# Patient Record
Sex: Male | Born: 1955 | Hispanic: Yes | Marital: Married | Smoking: Former smoker
Health system: Southern US, Community
[De-identification: ages and names within clinical notes are randomized; demographics above are authoritative.]

## PROBLEM LIST (undated history)

## (undated) ENCOUNTER — Emergency Department: Admission: EM | Payer: Commercial Managed Care - PPO | Source: Home / Self Care

## (undated) DIAGNOSIS — I10 Essential (primary) hypertension: Secondary | ICD-10-CM

## (undated) DIAGNOSIS — M1990 Unspecified osteoarthritis, unspecified site: Secondary | ICD-10-CM

## (undated) DIAGNOSIS — G629 Polyneuropathy, unspecified: Secondary | ICD-10-CM

## (undated) DIAGNOSIS — K631 Perforation of intestine (nontraumatic): Secondary | ICD-10-CM

## (undated) DIAGNOSIS — G709 Myoneural disorder, unspecified: Secondary | ICD-10-CM

## (undated) DIAGNOSIS — R251 Tremor, unspecified: Secondary | ICD-10-CM

## (undated) DIAGNOSIS — K5792 Diverticulitis of intestine, part unspecified, without perforation or abscess without bleeding: Secondary | ICD-10-CM

## (undated) HISTORY — PX: COLON SURGERY: SHX602

## (undated) HISTORY — DX: Diverticulitis of intestine, part unspecified, without perforation or abscess without bleeding: K57.92

## (undated) HISTORY — PX: HERNIA REPAIR: SHX51

## (undated) HISTORY — DX: Perforation of intestine (nontraumatic): K63.1

## (undated) SURGERY — Surgical Case
Anesthesia: *Unknown

---

## 2007-09-05 ENCOUNTER — Other Ambulatory Visit: Payer: Self-pay

## 2007-09-05 ENCOUNTER — Emergency Department: Payer: Self-pay | Admitting: Emergency Medicine

## 2007-12-10 ENCOUNTER — Ambulatory Visit: Payer: Self-pay | Admitting: Family Medicine

## 2007-12-16 ENCOUNTER — Ambulatory Visit: Payer: Self-pay | Admitting: Family Medicine

## 2012-09-10 ENCOUNTER — Emergency Department: Payer: Self-pay | Admitting: Emergency Medicine

## 2015-04-09 ENCOUNTER — Emergency Department: Payer: Commercial Managed Care - PPO

## 2015-04-09 ENCOUNTER — Inpatient Hospital Stay
Admission: EM | Admit: 2015-04-09 | Discharge: 2015-04-13 | DRG: 330 | Disposition: A | Discharge status: Home / Self Care | Payer: Commercial Managed Care - PPO | Attending: Surgery | Admitting: Surgery

## 2015-04-09 ENCOUNTER — Encounter: Payer: Self-pay | Admitting: *Deleted

## 2015-04-09 ENCOUNTER — Emergency Department: Payer: Commercial Managed Care - PPO | Admitting: Anesthesiology

## 2015-04-09 ENCOUNTER — Encounter
Admission: EM | Disposition: A | Discharge status: Home / Self Care | Payer: Self-pay | Source: Home / Self Care | Attending: Surgery

## 2015-04-09 DIAGNOSIS — R69 Illness, unspecified: Secondary | ICD-10-CM | POA: Diagnosis not present

## 2015-04-09 DIAGNOSIS — K5700 Diverticulitis of small intestine with perforation and abscess without bleeding: Principal | ICD-10-CM | POA: Diagnosis present

## 2015-04-09 DIAGNOSIS — Z87891 Personal history of nicotine dependence: Secondary | ICD-10-CM

## 2015-04-09 DIAGNOSIS — R198 Other specified symptoms and signs involving the digestive system and abdomen: Secondary | ICD-10-CM | POA: Diagnosis present

## 2015-04-09 DIAGNOSIS — K631 Perforation of intestine (nontraumatic): Secondary | ICD-10-CM | POA: Diagnosis present

## 2015-04-09 HISTORY — PX: BOWEL RESECTION: SHX1257

## 2015-04-09 HISTORY — PX: LAPAROTOMY: SHX154

## 2015-04-09 LAB — HEPATIC FUNCTION PANEL
ALK PHOS: 81 U/L (ref 38–126)
ALT: 18 U/L (ref 17–63)
AST: 17 U/L (ref 15–41)
Albumin: 3.5 g/dL (ref 3.5–5.0)
BILIRUBIN DIRECT: 0.2 mg/dL (ref 0.1–0.5)
BILIRUBIN INDIRECT: 0.6 mg/dL (ref 0.3–0.9)
TOTAL PROTEIN: 6.9 g/dL (ref 6.5–8.1)
Total Bilirubin: 0.8 mg/dL (ref 0.3–1.2)

## 2015-04-09 LAB — LIPASE, BLOOD: Lipase: 38 U/L (ref 11–51)

## 2015-04-09 LAB — URINALYSIS COMPLETE WITH MICROSCOPIC (ARMC ONLY)
BACTERIA UA: NONE SEEN
Bilirubin Urine: NEGATIVE
Glucose, UA: NEGATIVE mg/dL
Hgb urine dipstick: NEGATIVE
Ketones, ur: NEGATIVE mg/dL
LEUKOCYTES UA: NEGATIVE
NITRITE: NEGATIVE
PH: 5 (ref 5.0–8.0)
Protein, ur: 30 mg/dL — AB
SPECIFIC GRAVITY, URINE: 1.026 (ref 1.005–1.030)

## 2015-04-09 LAB — CBC WITH DIFFERENTIAL/PLATELET
Basophils Absolute: 0 10*3/uL (ref 0–0.1)
Basophils Relative: 0 %
Eosinophils Absolute: 0.2 10*3/uL (ref 0–0.7)
Eosinophils Relative: 2 %
HEMATOCRIT: 41.3 % (ref 40.0–52.0)
HEMOGLOBIN: 14.1 g/dL (ref 13.0–18.0)
LYMPHS PCT: 16 %
Lymphs Abs: 1.5 10*3/uL (ref 1.0–3.6)
MCH: 29.4 pg (ref 26.0–34.0)
MCHC: 34.2 g/dL (ref 32.0–36.0)
MCV: 85.7 fL (ref 80.0–100.0)
MONOS PCT: 7 %
Monocytes Absolute: 0.6 10*3/uL (ref 0.2–1.0)
NEUTROS ABS: 6.7 10*3/uL — AB (ref 1.4–6.5)
Neutrophils Relative %: 75 %
Platelets: 279 10*3/uL (ref 150–440)
RBC: 4.82 MIL/uL (ref 4.40–5.90)
RDW: 13.1 % (ref 11.5–14.5)
WBC: 9 10*3/uL (ref 3.8–10.6)

## 2015-04-09 LAB — BASIC METABOLIC PANEL
ANION GAP: 13 (ref 5–15)
BUN: 31 mg/dL — ABNORMAL HIGH (ref 6–20)
CHLORIDE: 100 mmol/L — AB (ref 101–111)
CO2: 26 mmol/L (ref 22–32)
CREATININE: 1.55 mg/dL — AB (ref 0.61–1.24)
Calcium: 9.4 mg/dL (ref 8.9–10.3)
GFR calc Af Amer: 54 mL/min — ABNORMAL LOW (ref >60)
GFR calc non Af Amer: 47 mL/min — ABNORMAL LOW (ref >60)
Glucose, Bld: 134 mg/dL — ABNORMAL HIGH (ref 65–99)
Potassium: 2.9 mmol/L — CL (ref 3.5–5.1)
Sodium: 139 mmol/L (ref 135–145)

## 2015-04-09 LAB — LACTIC ACID, PLASMA: LACTIC ACID, VENOUS: 0.9 mmol/L (ref 0.5–2.0)

## 2015-04-09 LAB — TROPONIN I: Troponin I: <0.03 ng/mL (ref <0.031)

## 2015-04-09 SURGERY — LAPAROTOMY, EXPLORATORY
Anesthesia: General | Site: Abdomen | Wound class: Clean Contaminated

## 2015-04-09 MED ORDER — BUPIVACAINE-EPINEPHRINE (PF) 0.25% -1:200000 IJ SOLN
INTRAMUSCULAR | Status: AC
  Filled 2015-04-09: qty 90

## 2015-04-09 MED ORDER — CEFAZOLIN SODIUM 1-5 GM-% IV SOLN
1.0000 g | Freq: Four times a day (QID) | INTRAVENOUS | Status: DC
  Administered 2015-04-10 – 2015-04-13 (×13): 1 g via INTRAVENOUS
  Filled 2015-04-09 (×17): qty 50

## 2015-04-09 MED ORDER — ROCURONIUM BROMIDE 100 MG/10ML IV SOLN
INTRAVENOUS | Status: DC | PRN
  Administered 2015-04-09: 30 mg via INTRAVENOUS

## 2015-04-09 MED ORDER — DEXTROSE IN LACTATED RINGERS 5 % IV SOLN
INTRAVENOUS | Status: DC
  Administered 2015-04-10 – 2015-04-11 (×4): via INTRAVENOUS
  Administered 2015-04-11 (×2): 1 mL via INTRAVENOUS
  Administered 2015-04-12: 12:00:00 via INTRAVENOUS
  Administered 2015-04-12: 1 mL via INTRAVENOUS
  Administered 2015-04-13: 01:00:00 via INTRAVENOUS

## 2015-04-09 MED ORDER — GLYCOPYRROLATE 0.2 MG/ML IJ SOLN
INTRAMUSCULAR | Status: DC | PRN
  Administered 2015-04-09: 0.6 mg via INTRAVENOUS

## 2015-04-09 MED ORDER — PIPERACILLIN-TAZOBACTAM 3.375 G IVPB
3.3750 g | Freq: Once | INTRAVENOUS | Status: AC
  Administered 2015-04-09: 3.375 g via INTRAVENOUS
  Filled 2015-04-09: qty 50

## 2015-04-09 MED ORDER — FENTANYL CITRATE (PF) 100 MCG/2ML IJ SOLN
INTRAMUSCULAR | Status: DC | PRN
  Administered 2015-04-09 (×2): 50 ug via INTRAVENOUS

## 2015-04-09 MED ORDER — IOHEXOL 350 MG/ML SOLN
80.0000 mL | Freq: Once | INTRAVENOUS | Status: AC | PRN
  Administered 2015-04-09: 100 mL via INTRAVENOUS

## 2015-04-09 MED ORDER — MORPHINE SULFATE (PF) 2 MG/ML IV SOLN
2.0000 mg | INTRAVENOUS | Status: DC | PRN
  Administered 2015-04-10: 2 mg via INTRAVENOUS

## 2015-04-09 MED ORDER — PROMETHAZINE HCL 25 MG/ML IJ SOLN: 6.2500 mg | INTRAMUSCULAR | Status: DC | PRN

## 2015-04-09 MED ORDER — MORPHINE SULFATE (PF) 4 MG/ML IV SOLN
4.0000 mg | Freq: Once | INTRAVENOUS | Status: AC
  Administered 2015-04-09: 4 mg via INTRAVENOUS

## 2015-04-09 MED ORDER — ONDANSETRON HCL 4 MG PO TABS: 4.0000 mg | ORAL_TABLET | Freq: Four times a day (QID) | ORAL | Status: DC | PRN

## 2015-04-09 MED ORDER — ONDANSETRON HCL 4 MG/2ML IJ SOLN: 4.0000 mg | Freq: Four times a day (QID) | INTRAMUSCULAR | Status: DC | PRN

## 2015-04-09 MED ORDER — ONDANSETRON HCL 4 MG/2ML IJ SOLN
INTRAMUSCULAR | Status: DC | PRN
  Administered 2015-04-09: 4 mg via INTRAVENOUS

## 2015-04-09 MED ORDER — ONDANSETRON HCL 4 MG/2ML IJ SOLN
INTRAMUSCULAR | Status: AC
  Administered 2015-04-09: 4 mg via INTRAVENOUS
  Filled 2015-04-09: qty 2

## 2015-04-09 MED ORDER — HEPARIN SODIUM (PORCINE) 5000 UNIT/ML IJ SOLN
5000.0000 [IU] | Freq: Three times a day (TID) | INTRAMUSCULAR | Status: DC
  Administered 2015-04-10 – 2015-04-13 (×9): 5000 [IU] via SUBCUTANEOUS
  Filled 2015-04-09 (×9): qty 1

## 2015-04-09 MED ORDER — LIDOCAINE HCL (CARDIAC) 20 MG/ML IV SOLN
INTRAVENOUS | Status: DC | PRN
  Administered 2015-04-09: 40 mg via INTRAVENOUS

## 2015-04-09 MED ORDER — MORPHINE SULFATE (PF) 4 MG/ML IV SOLN
INTRAVENOUS | Status: AC
  Administered 2015-04-09: 4 mg via INTRAVENOUS
  Filled 2015-04-09: qty 1

## 2015-04-09 MED ORDER — SUCCINYLCHOLINE CHLORIDE 20 MG/ML IJ SOLN
INTRAMUSCULAR | Status: DC | PRN
Start: 2015-04-09 — End: 2015-04-09
  Administered 2015-04-09: 100 mg via INTRAVENOUS

## 2015-04-09 MED ORDER — NEOSTIGMINE METHYLSULFATE 10 MG/10ML IV SOLN
INTRAVENOUS | Status: DC | PRN
  Administered 2015-04-09: 4 mg via INTRAVENOUS

## 2015-04-09 MED ORDER — LACTATED RINGERS IV SOLN
INTRAVENOUS | Status: DC
  Administered 2015-04-09: 23:00:00 via INTRAVENOUS

## 2015-04-09 MED ORDER — BUPIVACAINE-EPINEPHRINE 0.25% -1:200000 IJ SOLN
INTRAMUSCULAR | Status: DC | PRN
  Administered 2015-04-09: 30 mL

## 2015-04-09 MED ORDER — FENTANYL CITRATE (PF) 100 MCG/2ML IJ SOLN
25.0000 ug | INTRAMUSCULAR | Status: DC | PRN
  Administered 2015-04-10 (×3): 25 ug via INTRAVENOUS

## 2015-04-09 MED ORDER — ONDANSETRON HCL 4 MG/2ML IJ SOLN
4.0000 mg | Freq: Once | INTRAMUSCULAR | Status: AC
  Administered 2015-04-09: 4 mg via INTRAVENOUS

## 2015-04-09 MED ORDER — PROPOFOL 10 MG/ML IV BOLUS
INTRAVENOUS | Status: DC | PRN
  Administered 2015-04-09: 150 mg via INTRAVENOUS

## 2015-04-09 MED ORDER — HEPARIN SODIUM (PORCINE) 5000 UNIT/ML IJ SOLN: 5000.0000 [IU] | Freq: Three times a day (TID) | INTRAMUSCULAR | Status: DC

## 2015-04-09 MED ORDER — IOHEXOL 240 MG/ML SOLN
25.0000 mL | Freq: Once | INTRAMUSCULAR | Status: AC | PRN
  Administered 2015-04-09: 25 mL via ORAL

## 2015-04-09 SURGICAL SUPPLY — 32 items
CANISTER SUCT 1200ML W/VALVE (MISCELLANEOUS) ×2 IMPLANT
CATH TRAY 16F METER LATEX (MISCELLANEOUS) ×2 IMPLANT
CHLORAPREP W/TINT 26ML (MISCELLANEOUS) ×2 IMPLANT
DRAPE LAPAROTOMY 100X77 ABD (DRAPES) ×2 IMPLANT
DRSG TELFA 3X8 NADH (GAUZE/BANDAGES/DRESSINGS) ×2 IMPLANT
GAUZE SPONGE 4X4 12PLY STRL (GAUZE/BANDAGES/DRESSINGS) ×2 IMPLANT
GLOVE BIO SURGEON STRL SZ8 (GLOVE) ×2 IMPLANT
GOWN STRL REUS W/ TWL LRG LVL3 (GOWN DISPOSABLE) ×2 IMPLANT
GOWN STRL REUS W/TWL LRG LVL3 (GOWN DISPOSABLE) ×2
KIT RM TURNOVER STRD PROC AR (KITS) ×2 IMPLANT
LABEL OR SOLS (LABEL) ×2 IMPLANT
NDL SAFETY 22GX1.5 (NEEDLE) ×2 IMPLANT
NS IRRIG 1000ML POUR BTL (IV SOLUTION) ×2 IMPLANT
PACK BASIN MAJOR ARMC (MISCELLANEOUS) ×2 IMPLANT
PACK COLON CLEAN CLOSURE (MISCELLANEOUS) ×2 IMPLANT
PAD GROUND ADULT SPLIT (MISCELLANEOUS) ×2 IMPLANT
RELOAD LINEAR CUT PROX 55 BLUE (ENDOMECHANICALS) ×4 IMPLANT
RELOAD STAPLER LINEAR PROX 30 (STAPLE) ×1 IMPLANT
SEPRAFILM MEMBRANE 5X6 (MISCELLANEOUS) ×2 IMPLANT
STAPLER PROXIMATE 55 BLUE (STAPLE) ×2 IMPLANT
STAPLER RELOAD LINEAR PROX 30 (STAPLE) ×2
STAPLER SKIN PROX 35W (STAPLE) ×2 IMPLANT
SUT PDS AB 1 TP1 54 (SUTURE) ×4 IMPLANT
SUT PROLENE 0 CT 1 30 (SUTURE) ×6 IMPLANT
SUT SILK 0 (SUTURE) ×1
SUT SILK 0 30XBRD TIE 6 (SUTURE) ×1 IMPLANT
SUT SILK 3-0 (SUTURE) ×2 IMPLANT
SUT VIC AB 3-0 SH 27 (SUTURE) ×1
SUT VIC AB 3-0 SH 27X BRD (SUTURE) ×1 IMPLANT
SUT VICRYL 2 0 18  UND BR (SUTURE) ×1
SUT VICRYL 2 0 18 UND BR (SUTURE) ×1 IMPLANT
SYRINGE 10CC LL (SYRINGE) ×2 IMPLANT

## 2015-04-09 NOTE — H&P (Signed)
Parker Bishop is an 59 y.o. male.    Chief Complaint: Abdominal pain  HPI: This patient with 2 days of abdominal pain started acutely and suddenly after drinking orange juice on Sunday and has been gradually worsening. He is accompanied by his family and a son is acting as an interpreter initially but a formal interpreter is utilized later for consent.  he describes nausea and vomiting normal bowel movement yesterday fevers or chills never had an episode before like this Denies chest pain and eyes dysuria History reviewed. No pertinent past medical history.  No past surgical history on file.  No family history on file. Social History:  reports that he has never smoked. He does not have any smokeless tobacco history on file. He reports that he does not drink alcohol. His drug history is not on file.  Allergies: No Known Allergies   (Not in a hospital admission)   Review of Systems  Constitutional: Negative for fever and chills.  HENT: Negative.   Eyes: Negative.   Respiratory: Negative.   Cardiovascular: Negative.   Gastrointestinal: Positive for heartburn, nausea, vomiting and abdominal pain. Negative for diarrhea, constipation, blood in stool and melena.  Genitourinary: Negative.   Musculoskeletal: Negative.   Skin: Negative.   Neurological: Negative.  Negative for weakness.  Endo/Heme/Allergies: Negative.   Psychiatric/Behavioral: Negative.      Physical Exam:  BP 117/84 mmHg  Pulse 86  Temp(Src) 98.4 F (36.9 C) (Oral)  Resp 20  Ht _0  (1.651 m)  Wt 180 lb (81.647 kg)  BMI 29.95 kg/m2  SpO2 98%  Physical Exam  Constitutional: He is oriented to person, place, and time. He appears distressed.  Disheveled and uncomfortable-appearing  HENT:  Head: Normocephalic and atraumatic.  Eyes: Pupils are equal, round, and reactive to light. Right eye exhibits no discharge. Left eye exhibits no discharge. No scleral icterus.  Neck: Normal range of motion. Neck supple.   Cardiovascular: Normal rate, regular rhythm and normal heart sounds.   Pulmonary/Chest: Effort normal and breath sounds normal. No respiratory distress. He has no wheezes. He has no rales.  Abdominal: Soft. Bowel sounds are normal. He exhibits distension. There is tenderness. There is rebound and guarding.  Maximal tenderness in left upper quadrant with percussion or rebound tenderness and a rigid abdomen  Genitourinary: Penis normal.  Musculoskeletal: Normal range of motion. He exhibits no edema.  Lymphadenopathy:    He has no cervical adenopathy.  Neurological: He is alert and oriented to person, place, and time.  Skin: Skin is warm. He is diaphoretic. No erythema.  Psychiatric: Affect normal.  Vitals reviewed.       Results for orders placed or performed during the hospital encounter of 04/09/15 (from the past 48 hour(s))  CBC WITH DIFFERENTIAL     Status: Abnormal   Collection Time: 04/09/15  4:55 PM  Result Value Ref Range   WBC 9.0 3.8 - 10.6 K/uL   RBC 4.82 4.40 - 5.90 MIL/uL   Hemoglobin 14.1 13.0 - 18.0 g/dL   HCT 41.3 40.0 - 52.0 %   MCV 85.7 80.0 - 100.0 fL   MCH 29.4 26.0 - 34.0 pg   MCHC 34.2 32.0 - 36.0 g/dL   RDW 13.1 11.5 - 14.5 %   Platelets 279 150 - 440 K/uL   Neutrophils Relative % 75 %   Neutro Abs 6.7 (H) 1.4 - 6.5 K/uL   Lymphocytes Relative 16 %   Lymphs Abs 1.5 1.0 - 3.6 K/uL   Monocytes  Relative 7 %   Monocytes Absolute 0.6 0.2 - 1.0 K/uL   Eosinophils Relative 2 %   Eosinophils Absolute 0.2 0 - 0.7 K/uL   Basophils Relative 0 %   Basophils Absolute 0.0 0 - 0.1 K/uL  Basic metabolic panel     Status: Abnormal   Collection Time: 04/09/15  4:55 PM  Result Value Ref Range   Sodium 139 135 - 145 mmol/L   Potassium 2.9 (LL) 3.5 - 5.1 mmol/L    Comment: CRITICAL RESULT CALLED TO, READ BACK BY AND VERIFIED WITH TERRY BROGAN AT 1823 ON 04/09/15 RWW    Chloride 100 (L) 101 - 111 mmol/L   CO2 26 22 - 32 mmol/L   Glucose, Bld 134 (H) 65 - 99 mg/dL    BUN 31 (H) 6 - 20 mg/dL   Creatinine, Ser 1.55 (H) 0.61 - 1.24 mg/dL   Calcium 9.4 8.9 - 10.3 mg/dL   GFR calc non Af Amer 47 (L) >60 mL/min   GFR calc Af Amer 54 (L) >60 mL/min    Comment: (NOTE) The eGFR has been calculated using the CKD EPI equation. This calculation has not been validated in all clinical situations. eGFR's persistently <60 mL/min signify possible Chronic Kidney Disease.    Anion gap 13 5 - 15  Lipase, blood     Status: None   Collection Time: 04/09/15  4:55 PM  Result Value Ref Range   Lipase 38 11 - 51 U/L  Urinalysis complete, with microscopic ([facility] only)     Status: Abnormal   Collection Time: 04/09/15  4:56 PM  Result Value Ref Range   Color, Urine YELLOW (A) YELLOW   APPearance CLEAR (A) CLEAR   Glucose, UA NEGATIVE NEGATIVE mg/dL   Bilirubin Urine NEGATIVE NEGATIVE   Ketones, ur NEGATIVE NEGATIVE mg/dL   Specific Gravity, Urine 1.026 1.005 - 1.030   Hgb urine dipstick NEGATIVE NEGATIVE   pH 5.0 5.0 - 8.0   Protein, ur 30 (A) NEGATIVE mg/dL   Nitrite NEGATIVE NEGATIVE   Leukocytes, UA NEGATIVE NEGATIVE   RBC / HPF 0-5 0 - 5 RBC/hpf   WBC, UA 0-5 0 - 5 WBC/hpf   Bacteria, UA NONE SEEN NONE SEEN   Squamous Epithelial / LPF 0-5 (A) NONE SEEN   Mucous PRESENT    Hyaline Casts, UA PRESENT   Lactic acid, plasma     Status: None   Collection Time: 04/09/15  6:15 PM  Result Value Ref Range   Lactic Acid, Venous 0.9 0.5 - 2.0 mmol/L  Troponin I     Status: None   Collection Time: 04/09/15  6:16 PM  Result Value Ref Range   Troponin I <0.03 <0.031 ng/mL    Comment:        NO INDICATION OF MYOCARDIAL INJURY.   Hepatic function panel     Status: None   Collection Time: 04/09/15  6:16 PM  Result Value Ref Range   Total Protein 6.9 6.5 - 8.1 g/dL   Albumin 3.5 3.5 - 5.0 g/dL   AST 17 15 - 41 U/L   ALT 18 17 - 63 U/L   Alkaline Phosphatase 81 38 - 126 U/L   Total Bilirubin 0.8 0.3 - 1.2 mg/dL   Bilirubin, Direct 0.2 0.1 - 0.5 mg/dL   Indirect  Bilirubin 0.6 0.3 - 0.9 mg/dL   Ct Abdomen Pelvis W Contrast  04/09/2015  CLINICAL DATA:  Generalized abdominal pain and right-sided back pain for several days. EXAM: CT  ABDOMEN AND PELVIS WITH CONTRAST TECHNIQUE: Multidetector CT imaging of the abdomen and pelvis was performed using the standard protocol following bolus administration of intravenous contrast. CONTRAST:  122m OMNIPAQUE IOHEXOL 350 MG/ML SOLN COMPARISON:  None. FINDINGS: Lung bases are within normal. Abdominal images demonstrate a normal liver, spleen, pancreas, gallbladder and adrenal glands. Stomach is within normal. Kidneys an ureters are normal. Appendix is normal. Minimal diverticulosis of the colon. There are several jejunal diverticula. There is inflammatory change within the mesentery adjacent several of the these closely approximated jejunal diverticula. There is evidence of perforation of the largest diverticula with mild adjacent extraluminal air. Findings compatible with acute jejunal diverticulitis with rupture. No evidence of bowel obstruction. Minimal calcified plaque over the aortic bifurcation and iliac arteries. Pelvic images demonstrate a right inguinal hernia containing the right anterior lateral corner of the bladder. Prostate and rectum are normal. No free pelvic fluid. Mild degenerate change of the spine and hips. IMPRESSION: Acute jejunal diverticulitis with focal perforation and mild adjacent extraluminal air. No bowel obstruction. Right inguinal hernia containing the right anterior lateral corner of the bladder. Minimal diverticulosis of the colon. Critical Value/emergent results were called by telephone at the time of interpretation on 04/09/2015 at 7:33 pm to Dr. PConni Slipper, who verbally acknowledged these results. Electronically Signed   By: DMarin OlpM.D.   On: 04/09/2015 19:33   Dg Chest Portable 1 View  04/09/2015  CLINICAL DATA:  Upper abdominal pain, nausea, vomiting and diarrhea for 3 days. Initial  encounter. EXAM: PORTABLE CHEST 1 VIEW COMPARISON:  None. FINDINGS: The lungs are well-aerated. Pulmonary vascularity is at the upper limits of normal. Minimal bibasilar atelectasis is noted. There is no evidence of pleural effusion or pneumothorax. The cardiomediastinal silhouette is borderline normal in size. No acute osseous abnormalities are seen. IMPRESSION: Minimal bibasilar atelectasis noted.  Lungs otherwise clear. Electronically Signed   By: JGarald BaldingM.D.   On: 04/09/2015 18:12     Assessment/Plan  CT scan and labs personally reviewed  I discussed this patient with Dr. MRip Harbourin the emergency room and utilizing a an interpreter I was able to discuss with the patient and family that his history and indications for surgery. He clearly has perforated jejunal diverticulitis with phlegmon and requires exploratory laparotomy and bowel resection rationale for this was discussed via the interpreter the options of observation reviewed the consequences of not operating were reviewed and the risks of bleeding infection recurrence anastomotic leak were all reviewed the understood and agreed to proceed  RFlorene Glen MD, FACS

## 2015-04-09 NOTE — ED Notes (Signed)
Patient completed contrast. CT made aware.

## 2015-04-09 NOTE — Anesthesia Procedure Notes (Signed)
Procedure Name: Intubation Date/Time: 04/09/2015 10:44 PM Performed by: Waldo LaineJUSTIS, Nkenge Sonntag Pre-anesthesia Checklist: Patient identified, Emergency Drugs available, Suction available, Patient being monitored and Timeout performed Patient Re-evaluated:Patient Re-evaluated prior to inductionOxygen Delivery Method: Circle system utilized Preoxygenation: Pre-oxygenation with 100% oxygen Intubation Type: IV induction and Cricoid Pressure applied Laryngoscope Size: Miller and 2 Grade View: Grade I Tube type: Oral Number of attempts: 1 Airway Equipment and Method: Stylet Placement Confirmation: ETT inserted through vocal cords under direct vision,  positive ETCO2 and breath sounds checked- equal and bilateral Secured at: 21 cm Tube secured with: Tape Dental Injury: Teeth and Oropharynx as per pre-operative assessment

## 2015-04-09 NOTE — ED Notes (Signed)
Surgeon here. Waiting for translator.

## 2015-04-09 NOTE — ED Notes (Signed)
Pt reports abd pain and right side back pain.  Sx for several day.  No n/v/d.  Pt has taken no otc meds for abd pain.  No known injury to back.  Denies urinary sx.  [employer] officernterpreter harry [phone]

## 2015-04-09 NOTE — Transfer of Care (Signed)
Immediate Anesthesia Transfer of Care Note  Patient: Parker Bishop  Procedure(s) Performed: Procedure(s): EXPLORATORY LAPAROTOMY (N/A) SMALL BOWEL RESECTION (N/A)  Patient Location: PACU  Anesthesia Type:General  Level of Consciousness: awake, alert  and oriented  Airway & Oxygen Therapy: Patient Spontanous Breathing and Patient connected to face mask oxygen  Post-op Assessment: Report given to RN and Post -op Vital signs reviewed and stable  Post vital signs: Reviewed  Last Vitals:  Filed Vitals:   04/09/15 2130  BP: 122/85  Pulse: 89  Temp:   Resp: 14    Complications: No apparent anesthesia complications

## 2015-04-09 NOTE — Op Note (Signed)
   Pre-operative Diagnosis: Perforated jejunal diverticulitis  Post-operative Diagnosis: Same  Surgeon: Monti Villers   Assistants: Surgical tech  Anesthesia: Gen.  Dionne Milo   Surgeon: Adah Salvageichard E. Excell Seltzerooper, MD FACS  Anesthesia: Gen. with endotracheal tube   Procedure Details  The patient was seen again in the Holding Room. The benefits, complications, treatment options, and expected outcomes were discussed with the patient. The risks of bleeding, infection, recurrence of symptoms, failure to resolve symptoms,  bowel injury, any of which could require further surgery were reviewed with the patient.   The patient was taken to Operating Room, [name] and the procedure verified.  A Time Out was held and the above information confirmed.  Prior to the induction of general anesthesia, antibiotic prophylaxis was administered. VTE prophylaxis was in place. General endotracheal anesthesia was then administered and tolerated well. After the induction, the abdomen was prepped with Chloraprep and draped in the sterile fashion. The patient was positioned in the supine position.  A midline incision was utilized to open and explore the abdomen. The small bowel was brought out of the abdominal cavity through the wound and run from the ligament of Treitz to the ileocecal valve. An area of the proximal jejunum was easily elevated and found to be phlegmon is with a mass on the mesenteric side. This mass was soft and appeared gas-filled. Resection was then performed as follows. A GIA stapler was fired on either side of the phlegmon in healthy small bowel. The mesentery was divided between clamps and tied with 0 silk ligatures. Specimen was sent off for examination.  Side-to-side anastomosis was then performed utilizing a GIA stapler and a TA stapler followed by reinforcement with imbricating 3-0 silk suture. The rent in the mesentery was closed with figure-of-eight 3-0 silk sutures as well.  The  remainder the abdomen was inspected there was no sign of any spillage of intestinal contents.  A piece of Seprafilm was placed and the wound was closed with running #1 PDS. 30 cc of Marcaine with epinephrine was infiltrated into the skin and subcutaneous tissues and then the wound was closed with skin staples and a sterile dressing was placed.  Once lap needle count was correct the patient was taken to the recovery room in stable condition to be admitted for continued care.  Prior to closure nasogastric tube location was confirmed.  Findings: Perforated jejunal diverticulitis with phlegmon no obvious spillage   Estimated Blood Loss: Minimal         Drains: None         Specimens: Segment of jejunum          Complications: None                  Condition: Stable   [name], MD, FACS

## 2015-04-09 NOTE — ED Notes (Signed)
Report given to OR nurse. Will send orderly for patient.

## 2015-04-09 NOTE — ED Provider Notes (Signed)
[facility] Emergency Department Provider Note  ____________________________________________  Time seen: Approximately 6:35 PM  I have reviewed the triage vital signs and the nursing notes.   HISTORY  Chief Complaint Abdominal Pain    HPI Parker Bishop is a 59 y.o. male patient reports that on Saturday 4 days ago he had some orange juice at work. After he drank the orange juice he began having abdominal pain radiating around through to the back and in his shoulder began hurting as well. Patient reports he vomits and this pain feels better he eats and the pain gets worse. Also reports the pain is made worse when he goes over bumps in the road on his way here. A as I understand has been going on constantly since then waxing and waning as I noted above pain is moderate to moderately severe. It's just a "pain" there is nothing else associated with that he is not having any diarrhea or constipation. Patient also reports she's having some chest pain which may be burning and heavy in nature. His been going on since the belly is been hurting.  History reviewed. No pertinent past medical history.  Past history is significant only for gastritis  There are no active problems to display for this patient.   No past surgical history on file.  No current outpatient prescriptions on file.  Allergies Review of patient's allergies indicates no known allergies.  No family history on file.  Social History Social History  Substance Use Topics  . Smoking status: Never Smoker   . Smokeless tobacco: None  . Alcohol Use: No    Review of Systems Constitutional: No fever/chills Eyes: No visual changes. ENT: No sore throat. Cardiovascular: Denies chest pain. Respiratory: Denies shortness of breath. Gastrointestinal:abdominal pain.   nausea,  vomiting.  No diarrhea.  No constipation. Genitourinary: Negative for dysuria. Musculoskeletal: back pain. Skin: Negative for  rash. Neurological: Negative for headaches, focal weakness or numbness.  10-point ROS otherwise negative.  ____________________________________________   PHYSICAL EXAM:  VITAL SIGNS: ED Triage Vitals  Enc Vitals Group     BP 04/09/15 1647 128/66 mmHg     Pulse Rate 04/09/15 1647 86     Resp 04/09/15 1647 20     Temp 04/09/15 1647 98.4 F (36.9 C)     Temp Source 04/09/15 1647 Oral     SpO2 04/09/15 1647 96 %     Weight 04/09/15 1647 180 lb (81.647 kg)     Height 04/09/15 1647 5\' 5"  (1.651 m)     Head Cir --      Peak Flow --      Pain Score --      Pain Loc --      Pain Edu? --      Excl. in GC? --     Constitutional: Alert and oriented. Well appearing and in no acute distress. Eyes: Conjunctivae are normal. PERRL. EOMI. Head: Atraumatic. Nose: No congestion/rhinnorhea. Mouth/Throat: Mucous membranes are moist.  Oropharynx non-erythematous. Neck: No stridor. Cardiovascular: Normal rate, regular rhythm. Grossly normal heart sounds.  Good peripheral circulation. Respiratory: Normal respiratory effort.  No retractions. Lungs CTAB. Gastrointestinal: Soft but diffusely tender. Bowel sounds are decreased. Pain is patient is tender to palpation and percussion especially in the middle of the abdomen around the umbilicus.. No distention. No abdominal bruits. No CVA tenderness. Musculoskeletal: No lower extremity tenderness nor edema.  No joint effusions. Neurologic:  Normal speech and language. No gross focal neurologic deficits are appreciated. No  gait instability. Skin:  Skin is warm, dry and intact. No rash noted. Psychiatric: Mood and affect are normal. Speech and behavior are normal.  ____________________________________________   LABS (all labs ordered are listed, but only abnormal results are displayed)  Labs Reviewed  CBC WITH DIFFERENTIAL/PLATELET - Abnormal; Notable for the following:    Neutro Abs 6.7 (*)    All other components within normal limits  BASIC  METABOLIC PANEL - Abnormal; Notable for the following:    Potassium 2.9 (*)    Chloride 100 (*)    Glucose, Bld 134 (*)    BUN 31 (*)    Creatinine, Ser 1.55 (*)    GFR calc non Af Amer 47 (*)    GFR calc Af Amer 54 (*)    All other components within normal limits  URINALYSIS COMPLETEWITH MICROSCOPIC ([facility] ONLY) - Abnormal; Notable for the following:    Color, Urine YELLOW (*)    APPearance CLEAR (*)    Protein, ur 30 (*)    Squamous Epithelial / LPF 0-5 (*)    All other components within normal limits  URINE CULTURE  LIPASE, BLOOD  TROPONIN I  LACTIC ACID, PLASMA  HEPATIC FUNCTION PANEL  LACTIC ACID, PLASMA  CBC WITH DIFFERENTIAL/PLATELET   ____________________________________________  EKG EKG read and interpreted by me shows normal sinus rhythm at a rate of 87 normal axis no acute ST-T wave changes ____________________________________________  RADIOLOGY  Radiology calls to report the CT shows a diver reticulated severe jejunum. One of these diverticulitis areas is perforated in her some free air adjacent to the jejunum. ____________________________________________   PROCEDURES   ____________________________________________   INITIAL IMPRESSION / ASSESSMENT AND PLAN / ED COURSE  Pertinent labs & imaging results that were available during my care of the patient were reviewed by me and considered in my medical decision making (see chart for details).  Dr. Excell Seltzer called comes down to see the patient. ____________________________________________   FINAL CLINICAL IMPRESSION(S) / ED DIAGNOSES  Final diagnoses:  Perforated viscus      Arnaldo Natal, MD 04/09/15 2033

## 2015-04-09 NOTE — Anesthesia Preprocedure Evaluation (Signed)
Anesthesia Evaluation  Patient identified by MRN, date of birth, ID band Patient awake    Reviewed: Allergy & Precautions, H&P , NPO status , Patient's Chart, lab work & pertinent test results, reviewed documented beta blocker date and time   History of Anesthesia Complications Negative for: history of anesthetic complications  Airway Mallampati: III  TM Distance: >3 FB Neck ROM: full    Dental  (+) Chipped, Poor Dentition   Pulmonary former smoker,    Pulmonary exam normal breath sounds clear to auscultation       Cardiovascular Exercise Tolerance: Good negative cardio ROS Normal cardiovascular exam Rhythm:regular Rate:Normal     Neuro/Psych negative neurological ROS  negative psych ROS   GI/Hepatic Neg liver ROS, GERD  ,  Endo/Other  negative endocrine ROS  Renal/GU negative Renal ROS  negative genitourinary   Musculoskeletal   Abdominal   Peds  Hematology negative hematology ROS (+)   Anesthesia Other Findings History reviewed. No pertinent past medical history.  Patient does not go to the doctor on a regular basis and has no medical problems that he is aware of.  He does admit to some pain/pressure in his chest, which accompanies the feeling of needing to burp.  He states that friends and family have told him that it is likely acid reflux, but he has never gone to the doctor to have it checked.  No known cardiac history.  Reproductive/Obstetrics negative OB ROS                             Anesthesia Physical Anesthesia Plan  ASA: III and emergent  Anesthesia Plan: General, Rapid Sequence and Cricoid Pressure   Post-op Pain Management:    Induction:   Airway Management Planned:   Additional Equipment:   Intra-op Plan:   Post-operative Plan:   Informed Consent: I have reviewed the patients History and Physical, chart, labs and discussed the procedure including the risks,  benefits and alternatives for the proposed anesthesia with the patient or authorized representative who has indicated his/her understanding and acceptance.   Dental Advisory Given  Plan Discussed with: Anesthesiologist, CRNA and Surgeon  Anesthesia Plan Comments:         Anesthesia Quick Evaluation

## 2015-04-10 ENCOUNTER — Encounter: Payer: Self-pay | Admitting: Surgery

## 2015-04-10 LAB — BASIC METABOLIC PANEL
ANION GAP: 8 (ref 5–15)
BUN: 24 mg/dL — ABNORMAL HIGH (ref 6–20)
CO2: 27 mmol/L (ref 22–32)
Calcium: 8.4 mg/dL — ABNORMAL LOW (ref 8.9–10.3)
Chloride: 101 mmol/L (ref 101–111)
Creatinine, Ser: 1.07 mg/dL (ref 0.61–1.24)
GLUCOSE: 185 mg/dL — AB (ref 65–99)
POTASSIUM: 3.2 mmol/L — AB (ref 3.5–5.1)
Sodium: 136 mmol/L (ref 135–145)

## 2015-04-10 LAB — CBC
HEMATOCRIT: 37.5 % — AB (ref 40.0–52.0)
HEMOGLOBIN: 13.1 g/dL (ref 13.0–18.0)
MCH: 30.1 pg (ref 26.0–34.0)
MCHC: 35.1 g/dL (ref 32.0–36.0)
MCV: 85.8 fL (ref 80.0–100.0)
Platelets: 257 10*3/uL (ref 150–440)
RBC: 4.37 MIL/uL — AB (ref 4.40–5.90)
RDW: 13.1 % (ref 11.5–14.5)
WBC: 8.2 10*3/uL (ref 3.8–10.6)

## 2015-04-10 LAB — LACTIC ACID, PLASMA: Lactic Acid, Venous: 0.9 mmol/L (ref 0.5–2.0)

## 2015-04-10 MED ORDER — ACETAMINOPHEN 325 MG PO TABS: 650.0000 mg | ORAL_TABLET | Freq: Four times a day (QID) | ORAL | Status: DC | PRN

## 2015-04-10 MED ORDER — MORPHINE SULFATE (PF) 4 MG/ML IV SOLN
4.0000 mg | INTRAVENOUS | Status: DC | PRN
  Administered 2015-04-10 (×5): 4 mg via INTRAVENOUS
  Filled 2015-04-10 (×4): qty 1

## 2015-04-10 MED ORDER — MORPHINE SULFATE 2 MG/ML IV SOLN
INTRAVENOUS | Status: DC
  Administered 2015-04-10: 2 mg via INTRAVENOUS
  Administered 2015-04-10: 3 mg via INTRAVENOUS
  Administered 2015-04-10: 12:00:00 via INTRAVENOUS
  Administered 2015-04-11: 0 mg via INTRAVENOUS
  Administered 2015-04-11: 06:00:00 via INTRAVENOUS
  Administered 2015-04-11: 4 mg via INTRAVENOUS
  Administered 2015-04-11: 19:00:00 via INTRAVENOUS
  Administered 2015-04-11: 2 mg via INTRAVENOUS
  Administered 2015-04-12 (×2): 6 mg via INTRAVENOUS
  Administered 2015-04-12: 15 mg via INTRAVENOUS
  Administered 2015-04-12: 7.5 mg via INTRAVENOUS
  Filled 2015-04-10 (×4): qty 25

## 2015-04-10 MED ORDER — FENTANYL CITRATE (PF) 100 MCG/2ML IJ SOLN
INTRAMUSCULAR | Status: AC
  Filled 2015-04-10: qty 2

## 2015-04-10 NOTE — Progress Notes (Signed)
1 Day Post-Op  Subjective: Status post small bowel resection for perforated jejunal diverticulitis. Patient feels better today but has continuous pain and requiring PCA use.  Objective: Vital signs in last 24 hours: Temp:  [97.9 F (36.6 C)-100.4 F (38 C)] 98.7 F (37.1 C) (11/16 1328) Pulse Rate:  [86-97] 97 (11/16 0422) Resp:  [10-24] 10 (11/16 1650) BP: (118-138)/(60-85) 132/64 mmHg (11/16 1328) SpO2:  [91 %-99 %] 91 % (11/16 1650) FiO2 (%):  [0 %] 0 % (11/16 1650)    Intake/Output from previous day: 11/15 0701 - 11/16 0700 In: 800 [I.V.:800] Out: 75 [Urine:75] Intake/Output this shift:    Physical exam:  Vital signs are stable he is afebrile abdomen is soft and minimally tender minimally distended wound is dressed.  Lab Results: CBC   Recent Labs  04/09/15 1655 04/10/15 0518  WBC 9.0 8.2  HGB 14.1 13.1  HCT 41.3 37.5*  PLT 279 257   BMET  Recent Labs  04/09/15 1655 04/10/15 0518  NA 139 136  K 2.9* 3.2*  CL 100* 101  CO2 26 27  GLUCOSE 134* 185*  BUN 31* 24*  CREATININE 1.55* 1.07  CALCIUM 9.4 8.4*   PT/INR No results for input(s): LABPROT, INR in the last 72 hours. ABG No results for input(s): PHART, HCO3 in the last 72 hours.  Invalid input(s): PCO2, PO2  Studies/Results: Ct Abdomen Pelvis W Contrast  04/09/2015  CLINICAL DATA:  Generalized abdominal pain and right-sided back pain for several days. EXAM: CT ABDOMEN AND PELVIS WITH CONTRAST TECHNIQUE: Multidetector CT imaging of the abdomen and pelvis was performed using the standard protocol following bolus administration of intravenous contrast. CONTRAST:  100mL OMNIPAQUE IOHEXOL 350 MG/ML SOLN COMPARISON:  None. FINDINGS: Lung bases are within normal. Abdominal images demonstrate a normal liver, spleen, pancreas, gallbladder and adrenal glands. Stomach is within normal. Kidneys an ureters are normal. Appendix is normal. Minimal diverticulosis of the colon. 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 There is inflammatory change within the mesentery adjacent several of the these closely approximated jejunal diverticula. There is evidence of perforation of the largest diverticula with mild adjacent extraluminal air. Findings compatible with acute jejunal diverticulitis with rupture. No evidence of bowel obstruction. Minimal calcified plaque over the aortic bifurcation and iliac arteries. Pelvic images demonstrate a right inguinal hernia containing the right anterior lateral corner of the bladder. Prostate and rectum are normal. No free pelvic fluid. Mild degenerate change of the spine and hips. IMPRESSION: Acute jejunal diverticulitis with focal perforation and mild adjacent extraluminal air. No bowel obstruction. Right inguinal hernia containing the right anterior lateral corner of the bladder. Minimal diverticulosis of the colon. Critical Value/emergent results were called by telephone at the time of interpretation on 04/09/2015 at 7:33 pm to Dr. Dorothea GlassmanPAUL MALINDA , who verbally acknowledged these results. Electronically Signed   By: Elberta Fortisaniel  Boyle M.D.   On: 04/09/2015 19:33   Dg Chest Portable 1 View  04/09/2015  CLINICAL DATA:  Upper abdominal pain, nausea, vomiting and diarrhea for 3 days. Initial encounter. EXAM: PORTABLE CHEST 1 VIEW COMPARISON:  None. FINDINGS: The lungs are well-aerated. Pulmonary vascularity is at the upper limits of normal. Minimal bibasilar atelectasis is noted. There is no evidence of pleural effusion or pneumothorax. The cardiomediastinal silhouette is borderline normal in size. No acute osseous abnormalities are seen. IMPRESSION: Minimal bibasilar atelectasis noted.  Lungs otherwise clear. Electronically Signed   By: Roanna RaiderJeffery  Chang M.D.   On: 04/09/2015 18:12    Anti-infectives: Anti-infectives  Start     Dose/Rate Route Frequency Ordered Stop   04/10/15 0000  ceFAZolin (ANCEF) IVPB 1 g/50 mL premix     1 g 100 mL/hr over 30 Minutes Intravenous 4 times per day  04/09/15 2338     04/09/15 1945  piperacillin-tazobactam (ZOSYN) IVPB 3.375 g     3.375 g 12.5 mL/hr over 240 Minutes Intravenous  Once 04/09/15 1936 04/09/15 2352      Assessment/Plan: s/p Procedure(s): EXPLORATORY LAPAROTOMY SMALL BOWEL RESECTION   Patient doing well continue IV antibiotics and fluids as well as PCA he will require the nasogastric tube is well will discontinue Foley tomorrow morning.  Lattie Haw, MD, FACS  04/10/2015

## 2015-04-10 NOTE — Anesthesia Postprocedure Evaluation (Signed)
  Anesthesia Post-op Note  Patient: Parker Bishop  Procedure(s) Performed: Procedure(s): EXPLORATORY LAPAROTOMY (N/A) SMALL BOWEL RESECTION (N/A)  Anesthesia type:General, Rapid Sequence, Cricoid Pressure  Patient location: PACU  Post pain: Pain level controlled  Post assessment: Post-op Vital signs reviewed, Patient's Cardiovascular Status Stable, Respiratory Function Stable, Patent Airway and No signs of Nausea or vomiting  Post vital signs: Reviewed and stable Level of consciousness: awake, alert  and patient cooperative  Complications: No apparent anesthesia complications

## 2015-04-10 NOTE — Progress Notes (Signed)
Pt complaining of continual pain in abdomen, not relieved with prn morphine. New orders received for PCA pump. Utilized interpreter to educate pt on PCA use. PCA set up. Continue to assess.

## 2015-04-11 LAB — CBC WITH DIFFERENTIAL/PLATELET
Basophils Absolute: 0 10*3/uL (ref 0–0.1)
Basophils Relative: 1 %
Eosinophils Absolute: 0.1 10*3/uL (ref 0–0.7)
Eosinophils Relative: 1 %
HEMATOCRIT: 36.3 % — AB (ref 40.0–52.0)
Hemoglobin: 12.1 g/dL — ABNORMAL LOW (ref 13.0–18.0)
LYMPHS ABS: 0.5 10*3/uL — AB (ref 1.0–3.6)
LYMPHS PCT: 9 %
MCH: 28.9 pg (ref 26.0–34.0)
MCHC: 33.4 g/dL (ref 32.0–36.0)
MCV: 86.4 fL (ref 80.0–100.0)
MONO ABS: 0.5 10*3/uL (ref 0.2–1.0)
MONOS PCT: 9 %
NEUTROS ABS: 4.9 10*3/uL (ref 1.4–6.5)
Neutrophils Relative %: 80 %
Platelets: 241 10*3/uL (ref 150–440)
RBC: 4.2 MIL/uL — ABNORMAL LOW (ref 4.40–5.90)
RDW: 12.9 % (ref 11.5–14.5)
WBC: 6 10*3/uL (ref 3.8–10.6)

## 2015-04-11 LAB — URINE CULTURE
Culture: NO GROWTH
SPECIAL REQUESTS: NORMAL

## 2015-04-11 LAB — BASIC METABOLIC PANEL
ANION GAP: 6 (ref 5–15)
BUN: 16 mg/dL (ref 6–20)
CALCIUM: 8.6 mg/dL — AB (ref 8.9–10.3)
CO2: 30 mmol/L (ref 22–32)
CREATININE: 0.78 mg/dL (ref 0.61–1.24)
Chloride: 105 mmol/L (ref 101–111)
GLUCOSE: 148 mg/dL — AB (ref 65–99)
Potassium: 3.4 mmol/L — ABNORMAL LOW (ref 3.5–5.1)
Sodium: 141 mmol/L (ref 135–145)

## 2015-04-11 LAB — SURGICAL PATHOLOGY

## 2015-04-11 MED ORDER — MENTHOL 3 MG MT LOZG
1.0000 | LOZENGE | OROMUCOSAL | Status: DC | PRN
  Filled 2015-04-11: qty 9

## 2015-04-11 NOTE — Progress Notes (Signed)
Surgery  POD 2   S/P mall bowel resection for perforated jejunal diverticulitis.  The patient's Foley catheter was removed. He voided 75 cc. A postvoid residual bladder scan has yet to be obtained. Patient denies any passage of flatus. Upon arrival to the room the patient's nasogastric tube had been pulled back significantly. I repositioned it.  He is complaining of incisional pain. Seems well controlled on the existing PCA regimen.   Filed Vitals:   04/11/15 0748 04/11/15 1200 04/11/15 1357 04/11/15 1600  BP:   157/81   Pulse:   92   Temp:   98.4 F (36.9 C)   TempSrc:   Oral   Resp: 13 11  16   Height:      Weight:      SpO2: 93% 93% 97% 94%    PE: Alert and oriented., Lungs are clear bilaterally. Abdomen is soft with incisional tenderness and dressing is dry. Nasogastric tube is draining bilious fluid. Repositioned with return of 200 cc immediately.   Labs  CBC Latest Ref Rng 04/11/2015 04/10/2015 04/09/2015  WBC 3.8 - 10.6 K/uL 6.0 8.2 9.0  Hemoglobin 13.0 - 18.0 g/dL 12.1(L) 13.1 14.1  Hematocrit 40.0 - 52.0 % 36.3(L) 37.5(L) 41.3  Platelets 150 - 440 K/uL 241 257 279   CMP Latest Ref Rng 04/11/2015 04/10/2015 04/09/2015  Glucose 65 - 99 mg/dL 161(W148(H) 960(A185(H) 540(J134(H)  BUN 6 - 20 mg/dL 16 81(X24(H) 91(Y31(H)  Creatinine 0.61 - 1.24 mg/dL 7.820.78 9.561.07 2.13(Y1.55(H)  Sodium 135 - 145 mmol/L 141 136 139  Potassium 3.5 - 5.1 mmol/L 3.4(L) 3.2(L) 2.9(LL)  Chloride 101 - 111 mmol/L 105 101 100(L)  CO2 22 - 32 mmol/L 30 27 26   Calcium 8.9 - 10.3 mg/dL 8.6(V8.6(L) 7.8(I8.4(L) 9.4  Total Protein 6.5 - 8.1 g/dL - - 6.9  Total Bilirubin 0.3 - 1.2 mg/dL - - 0.8  Alkaline Phos 38 - 126 U/L - - 81  AST 15 - 41 U/L - - 17  ALT 17 - 63 U/L - - 18    I/O last 3 completed shifts: In: 4699.5 [I.V.:4379.5; NG/GT:170; IV Piggyback:150] Out: 1075 [Urine:925; Emesis/NG output:150] Total I/O In: 1677.5 [I.V.:1527.5; IV Piggyback:150] Out: 0     IMP  he is doing well. He may require reinsertion of the Foley  catheter if there is a large postvoid residual and if he has no postvoid residual present he'll require extra IV fluids.   Plan:  Continue IV antibiotics and nasogastric tube with or without insertion of Foley versus extra IV fluids.

## 2015-04-12 MED ORDER — HYDROCODONE-ACETAMINOPHEN 5-325 MG PO TABS
1.0000 | ORAL_TABLET | Freq: Four times a day (QID) | ORAL | Status: DC | PRN
  Administered 2015-04-12 – 2015-04-13 (×2): 1 via ORAL
  Filled 2015-04-12 (×2): qty 1

## 2015-04-12 MED ORDER — MORPHINE SULFATE (PF) 2 MG/ML IV SOLN: 2.0000 mg | INTRAVENOUS | Status: DC | PRN

## 2015-04-12 NOTE — Progress Notes (Signed)
Pt ambulated to bathroom with assistance and did very well. Pain is controlled with PCA. Will continue to monitor pt.  Karsten RoLauren E Hobbs

## 2015-04-12 NOTE — Progress Notes (Signed)
Surgery  POD 3   S/P small bowel resection for perforated jejunal diverticulitis.  Patient is without complaints of pain. He voided well after Foley catheter yesterday and did not require reinsertion of the Foley catheter.  He has not passed any gas yet nor had a bowel movement.  BP 152/80   Filed Vitals:   04/12/15 0411 04/12/15 0800 04/12/15 1024 04/12/15 1131  BP:      Pulse:      Temp:      TempSrc:      Resp: 14 11 13 15   Height:      Weight:      SpO2:  98% 100% 98%    PE: Dressing is dry. Abdomen is soft. Nondistended. Nasogastric tube is minimal. Extremities are warm and well-perfused.  Labs  CBC Latest Ref Rng 04/11/2015 04/10/2015 04/09/2015  WBC 3.8 - 10.6 K/uL 6.0 8.2 9.0  Hemoglobin 13.0 - 18.0 g/dL 12.1(L) 13.1 14.1  Hematocrit 40.0 - 52.0 % 36.3(L) 37.5(L) 41.3  Platelets 150 - 440 K/uL 241 257 279   CMP Latest Ref Rng 04/11/2015 04/10/2015 04/09/2015  Glucose 65 - 99 mg/dL 409(W148(H) 119(J185(H) 478(G134(H)  BUN 6 - 20 mg/dL 16 95(A24(H) 21(H31(H)  Creatinine 0.61 - 1.24 mg/dL 0.860.78 5.781.07 4.69(G1.55(H)  Sodium 135 - 145 mmol/L 141 136 139  Potassium 3.5 - 5.1 mmol/L 3.4(L) 3.2(L) 2.9(LL)  Chloride 101 - 111 mmol/L 105 101 100(L)  CO2 22 - 32 mmol/L 30 27 26   Calcium 8.9 - 10.3 mg/dL 2.9(B8.6(L) 2.8(U8.4(L) 9.4  Total Protein 6.5 - 8.1 g/dL - - 6.9  Total Bilirubin 0.3 - 1.2 mg/dL - - 0.8  Alkaline Phos 38 - 126 U/L - - 81  AST 15 - 41 U/L - - 17  ALT 17 - 63 U/L - - 18    I/O last 3 completed shifts: In: 6308 [I.V.:5638; NG/GT:470; IV Piggyback:200] Out: 2375 [Urine:1725; Emesis/NG output:650] Total I/O In: 888 [IV Piggyback:888] Out: 250 [Urine:250]    IMP improving. Nasogastric tube can be removed.  Plan his nasogastric tube will be removed. His diet will be advanced to clear liquids. PCA was discontinued and transitioned over to oral narcotics with IV back up. The patient was examined and interviewed with a certified spanish interpreter.

## 2015-04-13 MED ORDER — ONDANSETRON HCL 4 MG PO TABS: 4.0000 mg | ORAL_TABLET | Freq: Three times a day (TID) | ORAL | Status: DC | PRN

## 2015-04-13 MED ORDER — HYDROCODONE-ACETAMINOPHEN 5-325 MG PO TABS: 1.0000 | ORAL_TABLET | Freq: Four times a day (QID) | ORAL | Status: DC | PRN

## 2015-04-13 NOTE — Discharge Instructions (Signed)
*  Notify your doctor for signs of infection such as fever, increasing pain not relieved from the prescribed meds, drainage or redness at the incision. *Take all medications as prescribed.  * Drink plenty of fluids. *Notify MD with any questions or concerns.

## 2015-04-13 NOTE — Discharge Summary (Signed)
Physician Discharge Summary  Patient ID: Parker Bishop MRN: [id number] DOB/AGE: May 17, 1956 59 y.o.  Admit date: 04/09/2015 Discharge date: 04/13/2015  Admission Diagnoses: Perforated viscus  Discharge Diagnoses:  Active Problems:   Perforated viscus  secondary to perforated jejunal diverticulitis  Discharged Condition: Stable and improved  Hospital Course:  Patient was brought to the operating room the day of his admission. Porter laparotomy with small bowel resection was performed. Postoperatively nasogastric tube was left in place. Pain was well-controlled with PCA. On postoperative day #2 the patient had his Foley catheter removed and he was able to void without any further difficulty.  Postoperative day #3 the patient was continuing to do well. His nasogastric tube was removed at this time. Diet was advanced. By the 19th patient was doing very well and tolerating a regular diet with adequate oral pain control. His incision was clean dry and intact. He was discharged home in stable and satisfactory condition.  Discharge Exam: Blood pressure 165/88, pulse 69, temperature 98.6 F (37 C), temperature source Oral, resp. rate 18, height 5\' 5"  (1.651 m), weight 180 lb (81.647 kg), SpO2 94 %. Soft abdomen, incision is clean and dry.  Disposition: home with self care.  Discharge Instructions    Call MD for:  persistant nausea and vomiting    Complete by:  As directed      Call MD for:  redness, tenderness, or signs of infection (pain, swelling, redness, odor or green/yellow discharge around incision site)    Complete by:  As directed      Call MD for:  severe uncontrolled pain    Complete by:  As directed      Call MD for:  temperature >100.4    Complete by:  As directed      Diet - low sodium heart healthy    Complete by:  As directed      Discharge instructions    Complete by:  As directed   DISCHARGE INSTRUCTIONS TO PATIENT  REMINDER:  Carry a list of your medications and  allergies with you at all times Call your pharmacy at least 1 week in advance to refill prescriptions Do not mix any prescribed pain medicine with alcohol Do not drive any motor vehicles while taking pain medication. Take medications with food.  Do not retake a pain medication if you vomit after taking it.  Activity: no lifting more than 15 pounds until instructed by your doctor.   Dressing Care Instruction (if applicable):              Remove operative dressings in 48 hours.  May Shower-  Call office if any questions regarding this activity.  Dry Dressing as needed to operative site.  Drain care instructions provided to you in the hospital.   Follow-up appointments (date to return to physician): Call for appointment with Dr. Natale LayMark Zidane Renner, MD at [phone] or [phone]  If need MD on call after hours and on weekends call Hospital operator at [phone] as ask to speak to [facility].  Call Surgeon if you have: Temperature greater than 100.4 Persistent nausea and vomiting Severe uncontrolled pain Redness, tenderness, or signs of infection (pain, swelling, redness, odor or green/yellow discharge around the site) Difficulty breathing, headache or visual disturbances Hives Persistent dizziness or light-headedness Extreme fatigue Any other questions or concerns you may have after discharge  In an emergency, call 911 or go to an Emergency Department at a nearby hospital  Diet:  Resume your  usual diet.  Avoid spicy, greasy or heavy foods.  If you have nausea or vomiting, go back to liquids.  If you cannot keep liquids down, call your doctor.  Avoid alcohol consumption while on prescription pain medications. Good nutrition promotes healing. Increase fiber and fluids.     I understand and acknowledge receipt of the above instructions.                                                                                                                                         Patient or Guardian Signature                                                                    Date/Time                                                                                                                                        Physician's or R.N.'s Signature                                                                  Date/Time  The discharge instructions have been reviewed with the patient and/or Family Member/Parent/Guardian.  Patient and/or Family Member/Parent/Guardian signed and retained a printed copy.     Discharge wound care:    Complete by:  As directed   May shower     Increase activity slowly    Complete by:  As directed             Medication List    TAKE these medications        diphenhydrAMINE 25 MG tablet  Commonly known as:  BENADRYL  Take 75 mg by mouth every 6 (six) hours as needed (for headaches/back pain).     HYDROcodone-acetaminophen 5-325 MG tablet  Commonly known as:  NORCO/VICODIN  Take 1-2  tablets by mouth every 6 (six) hours as needed for moderate pain.     ondansetron 4 MG tablet  Commonly known as:  ZOFRAN  Take 1 tablet (4 mg total) by mouth every 8 (eight) hours as needed for nausea.           Follow-up Information    Follow up with Dionne Milo, MD In 1 week.   Specialty:  Surgery   Why:  For suture removal, For wound re-check   Contact information:   [address] [place] [address] [phone]       Signed: Natale Lay 04/13/2015, 11:30 AM

## 2015-04-13 NOTE — Progress Notes (Signed)
04/13/2015   BP 165/88 mmHg  Pulse 69  Temp(Src) 98.6 F (37 C) (Oral)  Resp 18  Ht 5\' 5"  (1.651 m)  Wt 81.647 kg (180 lb)  BMI 29.95 kg/m2  SpO2 94% Patient discharged per MD orders. Midline incision with staples intact. No drainage. Discharge instructions reviewed via facility interpreter with patient and patient verbalized understanding. IV removed per policy. Prescriptions discussed and given to patient. Discharged via wheelchair escorted by nursing staff.  Ron ParkerHerron, Akiyah Eppolito D, RN

## 2015-04-13 NOTE — Final Progress Note (Signed)
Surgery  POD 3   S/P small bowel resection for perforated jejunal diverticulitis.  The patient is without complaints. He is tolerating a regular diet. His abdomen is soft and nontender. He is without complaints of pain. No nausea no vomiting no difficulty voiding.  Filed Vitals:   04/12/15 1131 04/12/15 1414 04/12/15 2045 04/13/15 0648  BP:  159/98 153/93 165/88  Pulse:  76 79 69  Temp:   99.8 F (37.7 C) 98.6 F (37 C)  TempSrc:   Oral Oral  Resp: 15  16 18   Height:      Weight:      SpO2: 98% 100% 95% 94%    PE: Dressing was removed. Dressing was dry. Staple lines intact. No drainage is noted.  Labs  CBC Latest Ref Rng 04/11/2015 04/10/2015 04/09/2015  WBC 3.8 - 10.6 K/uL 6.0 8.2 9.0  Hemoglobin 13.0 - 18.0 g/dL 12.1(L) 13.1 14.1  Hematocrit 40.0 - 52.0 % 36.3(L) 37.5(L) 41.3  Platelets 150 - 440 K/uL 241 257 279   CMP Latest Ref Rng 04/11/2015 04/10/2015 04/09/2015  Glucose 65 - 99 mg/dL 161(W148(H) 960(A185(H) 540(J134(H)  BUN 6 - 20 mg/dL 16 81(X24(H) 91(Y31(H)  Creatinine 0.61 - 1.24 mg/dL 7.820.78 9.561.07 2.13(Y1.55(H)  Sodium 135 - 145 mmol/L 141 136 139  Potassium 3.5 - 5.1 mmol/L 3.4(L) 3.2(L) 2.9(LL)  Chloride 101 - 111 mmol/L 105 101 100(L)  CO2 22 - 32 mmol/L 30 27 26   Calcium 8.9 - 10.3 mg/dL 8.6(V8.6(L) 7.8(I8.4(L) 9.4  Total Protein 6.5 - 8.1 g/dL - - 6.9  Total Bilirubin 0.3 - 1.2 mg/dL - - 0.8  Alkaline Phos 38 - 126 U/L - - 81  AST 15 - 41 U/L - - 17  ALT 17 - 63 U/L - - 18    I/O last 3 completed shifts: In: 3428 [P.O.:880; I.V.:1610; NG/GT:50; IV Piggyback:888] Out: 3350 [Urine:2850; Emesis/NG output:500] Total I/O In: 391 [I.V.:391] Out: -     IMP he is doing very well on postoperative day #3 and is stable and improved for discharge.  Plan  stable for discharge. Follow-up in our office in 1 week's time. May shower. Discharge instructions were provided to the patient with a certified Spanish interpreter.

## 2015-04-15 ENCOUNTER — Telehealth: Payer: Self-pay

## 2015-04-15 NOTE — Telephone Encounter (Signed)
Patient had Exploratory Laparotomy with perforated diverticulitis on 11/15 with Dr. Excell Seltzerooper. Patient needs to have follow-up call since he was discharged on 11/19. He also needs to have a post-op appointment on 11/23 or 11/30, depending on which is better for patient. Please call.

## 2015-04-16 ENCOUNTER — Encounter: Payer: Self-pay | Admitting: *Deleted

## 2015-04-16 NOTE — Telephone Encounter (Signed)
Called patient and spoke to his wife Clotilde Dieter(Rosa) since patient was not home at this time. I asked Clotilde DieterRosa how her husband was doing and she stated that he was doing great. I asked her if it was okay for us to schedule her husband a post-operation appointment since we only had two slots available. Rosa agreed. I told her that his appointment would be tomorrow 04/17/2015 at 3:15 PM. I also told her that I would get an interpreter [facility]. Rosa understood and had no further questions.

## 2015-04-17 ENCOUNTER — Encounter: Payer: Self-pay | Admitting: Surgery

## 2015-04-18 ENCOUNTER — Emergency Department: Payer: Commercial Managed Care - PPO

## 2015-04-18 ENCOUNTER — Encounter: Payer: Self-pay | Admitting: *Deleted

## 2015-04-18 ENCOUNTER — Emergency Department
Admission: EM | Admit: 2015-04-18 | Discharge: 2015-04-18 | Disposition: A | Discharge status: Home / Self Care | Payer: Commercial Managed Care - PPO | Attending: Emergency Medicine | Admitting: Emergency Medicine

## 2015-04-18 DIAGNOSIS — R1084 Generalized abdominal pain: Secondary | ICD-10-CM | POA: Diagnosis not present

## 2015-04-18 DIAGNOSIS — Z79899 Other long term (current) drug therapy: Secondary | ICD-10-CM | POA: Diagnosis not present

## 2015-04-18 LAB — LIPASE, BLOOD: Lipase: 50 U/L (ref 11–51)

## 2015-04-18 LAB — URINALYSIS COMPLETE WITH MICROSCOPIC (ARMC ONLY)
Bacteria, UA: NONE SEEN
Bilirubin Urine: NEGATIVE
Glucose, UA: NEGATIVE mg/dL
HGB URINE DIPSTICK: NEGATIVE
KETONES UR: NEGATIVE mg/dL
NITRITE: NEGATIVE
PROTEIN: NEGATIVE mg/dL
RBC / HPF: NONE SEEN RBC/hpf (ref 0–5)
SPECIFIC GRAVITY, URINE: 1.025 (ref 1.005–1.030)
Squamous Epithelial / LPF: NONE SEEN
pH: 6 (ref 5.0–8.0)

## 2015-04-18 LAB — CBC WITH DIFFERENTIAL/PLATELET
Basophils Absolute: 0.1 10*3/uL (ref 0–0.1)
Basophils Relative: 1 %
EOS ABS: 0.2 10*3/uL (ref 0–0.7)
Eosinophils Relative: 3 %
HEMATOCRIT: 41 % (ref 40.0–52.0)
HEMOGLOBIN: 13.9 g/dL (ref 13.0–18.0)
LYMPHS ABS: 1.3 10*3/uL (ref 1.0–3.6)
LYMPHS PCT: 20 %
MCH: 29 pg (ref 26.0–34.0)
MCHC: 33.8 g/dL (ref 32.0–36.0)
MCV: 85.9 fL (ref 80.0–100.0)
Monocytes Absolute: 0.4 10*3/uL (ref 0.2–1.0)
Monocytes Relative: 6 %
NEUTROS ABS: 4.4 10*3/uL (ref 1.4–6.5)
NEUTROS PCT: 70 %
Platelets: 381 10*3/uL (ref 150–440)
RBC: 4.78 MIL/uL (ref 4.40–5.90)
RDW: 13.1 % (ref 11.5–14.5)
WBC: 6.3 10*3/uL (ref 3.8–10.6)

## 2015-04-18 LAB — COMPREHENSIVE METABOLIC PANEL
ALT: 97 U/L — ABNORMAL HIGH (ref 17–63)
ANION GAP: 7 (ref 5–15)
AST: 58 U/L — ABNORMAL HIGH (ref 15–41)
Albumin: 3.6 g/dL (ref 3.5–5.0)
Alkaline Phosphatase: 78 U/L (ref 38–126)
BUN: 19 mg/dL (ref 6–20)
CALCIUM: 8.9 mg/dL (ref 8.9–10.3)
CHLORIDE: 104 mmol/L (ref 101–111)
CO2: 23 mmol/L (ref 22–32)
Creatinine, Ser: 0.89 mg/dL (ref 0.61–1.24)
GFR calc non Af Amer: >60 mL/min (ref >60)
Glucose, Bld: 126 mg/dL — ABNORMAL HIGH (ref 65–99)
POTASSIUM: 4.3 mmol/L (ref 3.5–5.1)
SODIUM: 134 mmol/L — AB (ref 135–145)
Total Bilirubin: 0.5 mg/dL (ref 0.3–1.2)
Total Protein: 6.8 g/dL (ref 6.5–8.1)

## 2015-04-18 MED ORDER — HYDROMORPHONE HCL 1 MG/ML IJ SOLN
1.0000 mg | Freq: Once | INTRAMUSCULAR | Status: AC
  Administered 2015-04-18: 1 mg via INTRAVENOUS
  Filled 2015-04-18: qty 1

## 2015-04-18 MED ORDER — SENNA 8.6 MG PO TABS: 2.0000 | ORAL_TABLET | Freq: Two times a day (BID) | ORAL | Status: DC

## 2015-04-18 MED ORDER — DOCUSATE SODIUM 100 MG PO CAPS: 200.0000 mg | ORAL_CAPSULE | Freq: Two times a day (BID) | ORAL | Status: DC

## 2015-04-18 MED ORDER — IOHEXOL 240 MG/ML SOLN
50.0000 mL | INTRAMUSCULAR | Status: AC
  Administered 2015-04-18: 50 mL via ORAL

## 2015-04-18 MED ORDER — SODIUM CHLORIDE 0.9 % IV BOLUS (SEPSIS)
1000.0000 mL | Freq: Once | INTRAVENOUS | Status: AC
  Administered 2015-04-18: 1000 mL via INTRAVENOUS

## 2015-04-18 MED ORDER — IOHEXOL 350 MG/ML SOLN
100.0000 mL | Freq: Once | INTRAVENOUS | Status: AC | PRN
  Administered 2015-04-18: 100 mL via INTRAVENOUS

## 2015-04-18 MED ORDER — OXYCODONE-ACETAMINOPHEN 5-325 MG PO TABS: 1.0000 | ORAL_TABLET | Freq: Four times a day (QID) | ORAL | Status: DC | PRN

## 2015-04-18 MED ORDER — IOHEXOL 240 MG/ML SOLN: 50.0000 mL | Freq: Once | INTRAMUSCULAR | Status: DC | PRN

## 2015-04-18 MED ORDER — ONDANSETRON 8 MG PO TBDP
8.0000 mg | ORAL_TABLET | Freq: Three times a day (TID) | ORAL | Status: DC | PRN
Start: 2015-04-18 — End: 2015-04-24

## 2015-04-18 MED ORDER — ONDANSETRON HCL 4 MG/2ML IJ SOLN
4.0000 mg | Freq: Once | INTRAMUSCULAR | Status: AC
  Administered 2015-04-18: 4 mg via INTRAVENOUS
  Filled 2015-04-18: qty 2

## 2015-04-18 NOTE — ED Notes (Signed)
Pt drinking 2nd bottle of contrast for CT.

## 2015-04-18 NOTE — ED Provider Notes (Signed)
[facility] Emergency Department Provider Note  ____________________________________________  Time seen: 10:10 AM  I have reviewed the triage vital signs and the nursing notes.   HISTORY  Chief Complaint Abdominal Pain  Interviewed and examined with the Spanish interpreter at bedside  HPI Parker Bishop is a 59 y.o. male who complains of severe colicky diffuse abdominal pain radiating to his back for the last 3 days. It occurs about every hour. It is sharp and crampy. Worse with eating, no alleviating symptoms. Bowel movements have been normal, last bowel movement yesterday. No urinary symptoms. No vomiting fevers. He does have chills. He recently had surgery for perforated diverticulitis of the jejunum 2 weeks ago.     Past Medical History  Diagnosis Date  . Diverticulitis   . Bowel perforation [facility](HCC)      Patient Active Problem List   Diagnosis Date Noted  . Perforated viscus 04/09/2015     Past Surgical History  Procedure Laterality Date  . Laparotomy N/A 04/09/2015    Procedure: EXPLORATORY LAPAROTOMY;  Surgeon: Lattie Hawichard E Cooper, MD;  Location: [facility] ORS;  Service: General;  Laterality: N/A;  . Bowel resection N/A 04/09/2015    Procedure: SMALL BOWEL RESECTION;  Surgeon: Lattie Hawichard E Cooper, MD;  Location: [facility] ORS;  Service: General;  Laterality: N/A;     Current Outpatient Rx  Name  Route  Sig  Dispense  Refill  . diphenhydrAMINE (BENADRYL) 25 MG tablet   Oral   Take 75 mg by mouth every 6 (six) hours as needed (for headaches/back pain).         Marland Kitchen. docusate sodium (COLACE) 100 MG capsule   Oral   Take 2 capsules (200 mg total) by mouth 2 (two) times daily.   120 capsule   0   . HYDROcodone-acetaminophen (NORCO/VICODIN) 5-325 MG tablet   Oral   Take 1-2 tablets by mouth every 6 (six) hours as needed for moderate pain.   30 tablet   0     Dispense as written.   . ondansetron (ZOFRAN ODT) 8 MG disintegrating tablet   Oral   Take  1 tablet (8 mg total) by mouth every 8 (eight) hours as needed for nausea or vomiting.   20 tablet   0   . ondansetron (ZOFRAN) 4 MG tablet   Oral   Take 1 tablet (4 mg total) by mouth every 8 (eight) hours as needed for nausea.   20 tablet   0   . oxyCODONE-acetaminophen (ROXICET) 5-325 MG tablet   Oral   Take 1 tablet by mouth every 6 (six) hours as needed for severe pain.   12 tablet   0   . senna (SENOKOT) 8.6 MG TABS tablet   Oral   Take 2 tablets (17.2 mg total) by mouth 2 (two) times daily.   120 each   0      Allergies Review of patient's allergies indicates no known allergies.   No family history on file.  Social History Social History  Substance Use Topics  . Smoking status: Never Smoker   . Smokeless tobacco: None  . Alcohol Use: No    Review of Systems  Constitutional:   No fever or chills. No weight changes Eyes:   No blurry vision or double vision.  ENT:   No sore throat. Cardiovascular:   No chest pain. Respiratory:   No dyspnea or cough. Gastrointestinal:   Positive as above for abdominal pain for abdominal pain, doubt vomiting and diarrhea.  No BRBPR or melena. Genitourinary:   Negative for dysuria, urinary retention, bloody urine, or difficulty urinating. Musculoskeletal:   Negative for back pain. No joint swelling or pain. Skin:   Negative for rash. Neurological:   Negative for headaches, focal weakness or numbness. Psychiatric:  No anxiety or depression.   Endocrine:  No hot/cold intolerance, changes in energy, or sleep difficulty.  10-point ROS otherwise negative.  ____________________________________________   PHYSICAL EXAM:  VITAL SIGNS: ED Triage Vitals  Enc Vitals Group     BP 04/18/15 0959 133/79 mmHg     Pulse Rate 04/18/15 0959 73     Resp 04/18/15 0959 20     Temp 04/18/15 0959 98.1 F (36.7 C)     Temp Source 04/18/15 0959 Oral     SpO2 04/18/15 0959 97 %     Weight --      Height --      Head Cir --      Peak  Flow --      Pain Score --      Pain Loc --      Pain Edu? --      Excl. in GC? --      Constitutional:   Alert and oriented. Moderate distress due to pain. Eyes:   No scleral icterus. No conjunctival pallor. PERRL. EOMI ENT   Head:   Normocephalic and atraumatic.   Nose:   No congestion/rhinnorhea. No septal hematoma   Mouth/Throat:   MMM, no pharyngeal erythema. No peritonsillar mass. No uvula shift.   Neck:   No stridor. No SubQ emphysema. No meningismus. Hematological/Lymphatic/Immunilogical:   No cervical lymphadenopathy. Cardiovascular:   RRR. Normal and symmetric distal pulses are present in all extremities. No murmurs, rubs, or gallops. Respiratory:   Normal respiratory effort without tachypnea nor retractions. Breath sounds are clear and equal bilaterally. No wheezes/rales/rhonchi. Gastrointestinal:   Soft, mildly distended, diffuse tenderness. There is no CVA tenderness.  No rebound, rigidity, or guarding. Genitourinary:   deferred Musculoskeletal:   Nontender with normal range of motion in all extremities. No joint effusions.  No lower extremity tenderness.  No edema. Neurologic:   Normal speech and language.  CN 2-10 normal. Motor grossly intact. No pronator drift.  Normal gait. No gross focal neurologic deficits are appreciated.  Skin:    Skin is warm, dry and intact. No rash noted.  No petechiae, purpura, or bullae. Psychiatric:   Mood and affect are normal. Speech and behavior are normal. Patient exhibits appropriate insight and judgment.  ____________________________________________    LABS (pertinent positives/negatives) (all labs ordered are listed, but only abnormal results are displayed) Labs Reviewed  COMPREHENSIVE METABOLIC PANEL - Abnormal; Notable for the following:    Sodium 134 (*)    Glucose, Bld 126 (*)    AST 58 (*)    ALT 97 (*)    All other components within normal limits  URINALYSIS COMPLETEWITH MICROSCOPIC ([facility] ONLY) - Abnormal;  Notable for the following:    Color, Urine YELLOW (*)    APPearance CLEAR (*)    Leukocytes, UA TRACE (*)    All other components within normal limits  LIPASE, BLOOD  CBC WITH DIFFERENTIAL/PLATELET   ____________________________________________   EKG    ____________________________________________    RADIOLOGY  CT abdomen and pelvis unremarkable  ____________________________________________   PROCEDURES   ____________________________________________   INITIAL IMPRESSION / ASSESSMENT AND PLAN / ED COURSE  Pertinent labs & imaging results that were available during my care of the patient  were reviewed by me and considered in my medical decision making (see chart for details).  Patient presents with worsening abdominal pain, possible early peritonitis after recent surgery with perforated viscus. Labs and CT abdomen and pelvis while giving IV fluids, IV Dilaudid and Zofran. ----------------------------------------- 12:36 PM on 04/18/2015 -----------------------------------------  Patient feels much better. Workup negative. Vital signs stable and unremarkable. Repeat exam is improved and reassuring. Patient will follow up with Dr. Excell Seltzer. I will prescribe him Zofran and Percocet senna and Colace to manage his symptoms in the meantime. I suspect this is due to some scar tissue formation versus luminal gas due to the crampy colicky nature of the pain. All results and recommendations were discussed with the patient with a Spanish interpreter. He also has a English-speaking family member at bedside.  ____________________________________________   FINAL CLINICAL IMPRESSION(S) / ED DIAGNOSES  Final diagnoses:  Generalized abdominal pain      Sharman Cheek, MD 04/18/15 1238

## 2015-04-18 NOTE — Discharge Instructions (Signed)
You were prescribed a medication that is potentially sedating. Do not drink alcohol, drive or participate in any other potentially dangerous activities while taking this medication as it may make you sleepy. Do not take this medication with any other sedating medications, either prescription or over-the-counter. If you were prescribed Percocet or Vicodin, do not take these with acetaminophen (Tylenol) as it is already contained within these medications.   Opioid pain medications (or "narcotics") can be habit forming.  Use it as little as possible to achieve adequate pain control.  Do not use or use it with extreme caution if you have a history of opiate abuse or dependence.  If you are on a pain contract with your primary care doctor or a pain specialist, be sure to let them know you were prescribed this medication today from the [facility].  This medication is intended for your use only - do not give any to anyone else and keep it in a secure place where nobody else, especially children and pets, have access to it.  It will also cause or worsen constipation, so you may want to consider taking an over-the-counter stool softener while you are taking this medication.  Dolor abdominal en adultos (Abdominal Pain, Adult) El dolor puede tener muchas causas. Normalmente la causa del dolor abdominal no es una enfermedad y Scientist, clinical (histocompatibility and immunogenetics)mejorar sin TEFL teachertratamiento. Frecuentemente puede controlarse y tratarse en casa. Su mdico le Medical sales representativerealizar un examen fsico y posiblemente solicite anlisis de sangre y radiografas para ayudar a Chief Strategy Officerdeterminar la gravedad de su dolor. Sin embargo, en IAC/InterActiveCorpmuchos casos, debe transcurrir ms tiempo antes de que se pueda Clinical research associateencontrar una causa evidente del dolor. Antes de llegar a ese punto, es posible que su mdico no sepa si necesita ms pruebas o un tratamiento ms profundo. INSTRUCCIONES PARA EL CUIDADO EN EL HOGAR  Est atento al dolor para ver si hay cambios. Las siguientes  indicaciones ayudarn a Architectural technologistaliviar cualquier molestia que pueda sentir:  [place] solo medicamentos de venta libre o recetados, segn las indicaciones del mdico.  No tome laxantes a menos que se lo haya indicado su mdico.  Pruebe con Neomia Dearuna dieta lquida absoluta (caldo, t o agua) segn se lo indique su mdico. Introduzca gradualmente una dieta normal, segn su tolerancia. SOLICITE ATENCIN MDICA SI:  Tiene dolor abdominal sin explicacin.  Tiene dolor abdominal relacionado con nuseas o diarrea.  Tiene dolor cuando orina o defeca.  Experimenta dolor abdominal que lo despierta de noche.  Tiene dolor abdominal que empeora o mejora cuando come alimentos.  Tiene dolor abdominal que empeora cuando come alimentos grasosos.  Tiene fiebre. SOLICITE ATENCIN MDICA DE INMEDIATO SI:   El dolor no desaparece en un plazo mximo de 2horas.  No deja de (vomitar).  El Engineer, miningdolor se siente solo en partes del abdomen, como el lado derecho o la parte inferior izquierda del abdomen.  Evaca materia fecal sanguinolenta o negra, de aspecto alquitranado. ASEGRESE DE QUE:  Comprende estas instrucciones.  Controlar su afeccin.  Recibir ayuda de inmediato si no mejora o si empeora.   Esta informacin no tiene Theme park managercomo fin reemplazar el consejo del mdico. Asegrese de hacerle al mdico cualquier pregunta que tenga.   Document Released: 05/11/2005 Document Revised: 06/01/2014 Elsevier Interactive Patient Education [employer]2016 Elsevier Inc.

## 2015-04-18 NOTE — ED Notes (Signed)
MD at bedside. 

## 2015-04-18 NOTE — ED Notes (Signed)
abd back pain since surgery last week  , had part of his intestine out

## 2015-04-24 ENCOUNTER — Encounter: Payer: Self-pay | Admitting: Surgery

## 2015-04-24 ENCOUNTER — Ambulatory Visit (INDEPENDENT_AMBULATORY_CARE_PROVIDER_SITE_OTHER): Payer: Commercial Managed Care - PPO | Admitting: Surgery

## 2015-04-24 VITALS — BP 125/81 | HR 76 | Temp 98.5°F | Ht 62.0 in | Wt 170.0 lb

## 2015-04-24 DIAGNOSIS — R69 Illness, unspecified: Secondary | ICD-10-CM

## 2015-04-24 DIAGNOSIS — R198 Other specified symptoms and signs involving the digestive system and abdomen: Secondary | ICD-10-CM

## 2015-04-24 NOTE — Patient Instructions (Signed)
Por favor pongase hielo en el area donde le duele.  Lllame a su doctor de cabezera y comentele que tiene dolor en sus piernas Tribune Companyhasta los pies.  Puede empezar a trabajar el Lunes [address] de [place] sin levantar no mas de 15 libras.

## 2015-04-24 NOTE — Progress Notes (Signed)
59 yr old male with jejunal diverticular perforation s/p Ex lap and bowel resection.  Patient states doing well today.  States pain in abdomen resolved, he has been up and moving around.  He is eating and drinking well and having normal bowel movements.  He states having pain down right leg that feels like electrical shock going from thigh down to toes.    Filed Vitals:   04/24/15 1328  BP: 125/81  Pulse: 76  Temp: 98.5 F (36.9 C)    PE:  Gen: NAD  Abd: soft, nd, appropriately tender around incision site, staples removed Ext: no edema, 2+ pulses  A/P: Doing well postoperatively.  He may return to work on Monday for light duty and then full duty in 3 more weeks, work note given.  He was instructed to use tylenol and ibuprofen and ice to back for pain in leg, likely sciatic nerve pain.  He will f/u with pcp for sciatica

## 2015-05-21 ENCOUNTER — Emergency Department
Admission: EM | Admit: 2015-05-21 | Discharge: 2015-05-21 | Disposition: A | Discharge status: Home / Self Care | Payer: Commercial Managed Care - PPO | Attending: Emergency Medicine | Admitting: Emergency Medicine

## 2015-05-21 ENCOUNTER — Encounter: Payer: Self-pay | Admitting: Emergency Medicine

## 2015-05-21 ENCOUNTER — Emergency Department: Payer: Commercial Managed Care - PPO

## 2015-05-21 DIAGNOSIS — K4090 Unilateral inguinal hernia, without obstruction or gangrene, not specified as recurrent: Secondary | ICD-10-CM | POA: Insufficient documentation

## 2015-05-21 DIAGNOSIS — R52 Pain, unspecified: Secondary | ICD-10-CM

## 2015-05-21 DIAGNOSIS — R1909 Other intra-abdominal and pelvic swelling, mass and lump: Secondary | ICD-10-CM

## 2015-05-21 DIAGNOSIS — Z79899 Other long term (current) drug therapy: Secondary | ICD-10-CM | POA: Insufficient documentation

## 2015-05-21 DIAGNOSIS — Z87891 Personal history of nicotine dependence: Secondary | ICD-10-CM | POA: Diagnosis not present

## 2015-05-21 DIAGNOSIS — N50811 Right testicular pain: Secondary | ICD-10-CM | POA: Diagnosis present

## 2015-05-21 HISTORY — DX: Unspecified osteoarthritis, unspecified site: M19.90

## 2015-05-21 NOTE — ED Notes (Signed)
Awaiting interpreter

## 2015-05-21 NOTE — ED Notes (Signed)
Pt to ed with c/o right side testicular pain that started about 2 days pta.  Pt states hx of surgery on intestines about 1 month ago.  Reports severe pain with walking or movement. Skin warm and dry.  Pt alert and oriented, otto the interpreter at bedside

## 2015-05-21 NOTE — ED Notes (Signed)
Per interpreter Natalia LeatherwoodKatherine # [phone] Pt with bump to right testicle for two weeks, pain when walking. Pt did have recent surgery on his bowels and is currently taking antibiotics and stool softners. Pt denies any trouble urinating.

## 2015-05-21 NOTE — ED Provider Notes (Addendum)
[facility] Emergency Department Provider Note  ____________________________________________   I have reviewed the triage vital signs and the nursing notes.   HISTORY  Chief Complaint Testicle Pain    HPI Parker Bishop is a 59 y.o. male who is in November 18 had small bowel resection for perforated jejunal diverticulitis, he was doing well postoperatively and has had no complaints or concerns until one week ago when he started noticing a mass in his right scrotal region. It is painful sometimes. It is worse when he is standing up, when he lies down it retracts into his abdomen. He has had no fever no chills no nausea no dysuria no urinary frequency no trouble with bowel movements or other symptoms just the pain and swelling.  Past Medical History  Diagnosis Date  . Diverticulitis   . Bowel perforation (HCC)   . Arthritis     Patient Active Problem List   Diagnosis Date Noted  . Perforated viscus 04/09/2015    Past Surgical History  Procedure Laterality Date  . Laparotomy N/A 04/09/2015    Procedure: EXPLORATORY LAPAROTOMY;  Surgeon: Lattie Hawichard E Cooper, MD;  Location: [facility] ORS;  Service: General;  Laterality: N/A;  . Bowel resection N/A 04/09/2015    Procedure: SMALL BOWEL RESECTION;  Surgeon: Lattie Hawichard E Cooper, MD;  Location: [facility] ORS;  Service: General;  Laterality: N/A;  . Colon surgery      Current Outpatient Rx  Name  Route  Sig  Dispense  Refill  . docusate sodium (COLACE) 100 MG capsule   Oral   Take 100 mg by mouth 2 (two) times daily.         Marland Kitchen. oxyCODONE-acetaminophen (ROXICET) 5-325 MG tablet   Oral   Take 1 tablet by mouth every 6 (six) hours as needed for severe pain.   12 tablet   0   . senna (SENOKOT) 8.6 MG TABS tablet   Oral   Take 2 tablets (17.2 mg total) by mouth 2 (two) times daily.   120 each   0     Allergies Review of patient's allergies indicates no known allergies.  Family History  Problem Relation Age of  Onset  . Arthritis Mother   . Diverticulitis Father     Social History Social History  Substance Use Topics  . Smoking status: Former Smoker    Quit date: 04/23/1989  . Smokeless tobacco: Never Used  . Alcohol Use: No    Review of Systems Constitutional: No fever/chills Eyes: No visual changes. ENT: No sore throat. No stiff neck no neck pain Cardiovascular: Denies chest pain. Respiratory: Denies shortness of breath. Gastrointestinal:   no vomiting.  No diarrhea.  No constipation. Genitourinary: Negative for dysuria. Musculoskeletal: Negative lower extremity swelling Skin: Negative for rash. Neurological: Negative for headaches, focal weakness or numbness. 10-point ROS otherwise negative.  ____________________________________________   PHYSICAL EXAM:  VITAL SIGNS: ED Triage Vitals  Enc Vitals Group     BP 05/21/15 1247 161/91 mmHg     Pulse Rate 05/21/15 1247 77     Resp 05/21/15 1247 20     Temp 05/21/15 1247 98.3 F (36.8 C)     Temp Source 05/21/15 1247 Oral     SpO2 05/21/15 1247 96 %     Weight 05/21/15 1247 178 lb (80.74 kg)     Height 05/21/15 1247 5\' 3"  (1.6 m)     Head Cir --      Peak Flow --      Pain  Score 05/21/15 1256 7     Pain Loc --      Pain Edu? --      Excl. in GC? --     Constitutional: Alert and oriented. Well appearing and in no acute distress. Eyes: Conjunctivae are normal. PERRL. EOMI. Head: Atraumatic. Nose: No congestion/rhinnorhea. Mouth/Throat: Mucous membranes are moist.  Oropharynx non-erythematous. Neck: No stridor.   Nontender with no meningismus Cardiovascular: Normal rate, regular rhythm. Grossly normal heart sounds.  Good peripheral circulation. Respiratory: Normal respiratory effort.  No retractions. Lungs CTAB. Abdominal: Soft and nontender. No distention. No guarding no rebound Back:  There is no focal tenderness or step off there is no midline tenderness there are no lesions noted. there is no CVA tenderness GU:  There is a soft mass noted in the right scrotal region, both testicles appear normal, no penile lesions noted, the mass is with gentle manipulation reducible. Musculoskeletal: No lower extremity tenderness. No joint effusions, no DVT signs strong distal pulses no edema Neurologic:  Normal speech and language. No gross focal neurologic deficits are appreciated.  Skin:  Skin is warm, dry and intact. No rash noted. Psychiatric: Mood and affect are normal. Speech and behavior are normal.  ____________________________________________   LABS (all labs ordered are listed, but only abnormal results are displayed)  Labs Reviewed - No data to display ____________________________________________  EKG  I personally interpreted any EKGs ordered by me or triage  ____________________________________________  RADIOLOGY  I reviewed any imaging ordered by me or triage that were performed during my shift ____________________________________________   PROCEDURES  Procedure(s) performed: None  Critical Care performed: None  ____________________________________________   INITIAL IMPRESSION / ASSESSMENT AND PLAN / ED COURSE  Pertinent labs & imaging results that were available during my care of the patient were reviewed by me and considered in my medical decision making (see chart for details).  Patient with a reducible hernia into his scrotum. No dysuria no urinary frequency no evidence of abdominal blockage or other concerns. Patient quite well-appearing with good by mouth and again no evidence of incarceration. We'll talk to surgery about outpatient reduction and I  do not think further evaluation and assessment at this moment. Patient is without discomfort. ____________________________________________  ----------------------------------------- 3:59 PM on 05/21/2015 -----------------------------------------   Surgeon on call is in the operating room. Left message via nurse. Pt eager to go  home, understands what to do in the case of incarceration.   FINAL CLINICAL IMPRESSION(S) / ED DIAGNOSES  Final diagnoses:  Pain  Inguinal bulge     Jeanmarie Plant, MD 05/21/15 1538  Jeanmarie Plant, MD 05/21/15 [phone]

## 2015-05-22 ENCOUNTER — Telehealth: Payer: Self-pay | Admitting: Surgery

## 2015-05-22 NOTE — Telephone Encounter (Signed)
I have called interpreter services to call patient to make an appointment for follow up from ED. Mr. Parker Bishop called the patient, No answer and was not able to leave a voicemail.

## 2015-06-05 ENCOUNTER — Ambulatory Visit (INDEPENDENT_AMBULATORY_CARE_PROVIDER_SITE_OTHER): Payer: Commercial Managed Care - PPO | Admitting: General Surgery

## 2015-06-05 ENCOUNTER — Observation Stay: Payer: Commercial Managed Care - PPO

## 2015-06-05 ENCOUNTER — Observation Stay
Admission: AD | Admit: 2015-06-05 | Discharge: 2015-06-06 | Disposition: A | Discharge status: Home / Self Care | Payer: Commercial Managed Care - PPO | Source: Ambulatory Visit | Attending: Surgery | Admitting: Surgery

## 2015-06-05 ENCOUNTER — Encounter: Payer: Self-pay | Admitting: *Deleted

## 2015-06-05 ENCOUNTER — Encounter: Payer: Self-pay | Admitting: General Surgery

## 2015-06-05 VITALS — BP 191/101 | HR 88 | Temp 98.1°F | Ht 63.0 in | Wt 180.0 lb

## 2015-06-05 DIAGNOSIS — K4090 Unilateral inguinal hernia, without obstruction or gangrene, not specified as recurrent: Secondary | ICD-10-CM | POA: Diagnosis not present

## 2015-06-05 DIAGNOSIS — N400 Enlarged prostate without lower urinary tract symptoms: Secondary | ICD-10-CM | POA: Insufficient documentation

## 2015-06-05 DIAGNOSIS — R109 Unspecified abdominal pain: Secondary | ICD-10-CM | POA: Insufficient documentation

## 2015-06-05 DIAGNOSIS — M8580 Other specified disorders of bone density and structure, unspecified site: Secondary | ICD-10-CM | POA: Diagnosis not present

## 2015-06-05 DIAGNOSIS — R1010 Upper abdominal pain, unspecified: Principal | ICD-10-CM

## 2015-06-05 DIAGNOSIS — R1011 Right upper quadrant pain: Secondary | ICD-10-CM

## 2015-06-05 DIAGNOSIS — Z9889 Other specified postprocedural states: Secondary | ICD-10-CM | POA: Insufficient documentation

## 2015-06-05 DIAGNOSIS — Z8379 Family history of other diseases of the digestive system: Secondary | ICD-10-CM | POA: Diagnosis not present

## 2015-06-05 DIAGNOSIS — Z87891 Personal history of nicotine dependence: Secondary | ICD-10-CM | POA: Diagnosis not present

## 2015-06-05 DIAGNOSIS — Z8261 Family history of arthritis: Secondary | ICD-10-CM | POA: Insufficient documentation

## 2015-06-05 LAB — CBC WITH DIFFERENTIAL/PLATELET
BASOS ABS: 0 10*3/uL (ref 0–0.1)
Basophils Relative: 1 %
EOS ABS: 0.1 10*3/uL (ref 0–0.7)
EOS PCT: 2 %
HCT: 39.4 % — ABNORMAL LOW (ref 40.0–52.0)
Hemoglobin: 13.6 g/dL (ref 13.0–18.0)
LYMPHS ABS: 1.4 10*3/uL (ref 1.0–3.6)
Lymphocytes Relative: 26 %
MCH: 29.9 pg (ref 26.0–34.0)
MCHC: 34.5 g/dL (ref 32.0–36.0)
MCV: 86.6 fL (ref 80.0–100.0)
Monocytes Absolute: 0.3 10*3/uL (ref 0.2–1.0)
Monocytes Relative: 6 %
Neutro Abs: 3.6 10*3/uL (ref 1.4–6.5)
Neutrophils Relative %: 65 %
PLATELETS: 228 10*3/uL (ref 150–440)
RBC: 4.55 MIL/uL (ref 4.40–5.90)
RDW: 13.8 % (ref 11.5–14.5)
WBC: 5.5 10*3/uL (ref 3.8–10.6)

## 2015-06-05 LAB — COMPREHENSIVE METABOLIC PANEL
ALT: 21 U/L (ref 17–63)
AST: 19 U/L (ref 15–41)
Albumin: 4.1 g/dL (ref 3.5–5.0)
Alkaline Phosphatase: 80 U/L (ref 38–126)
Anion gap: 6 (ref 5–15)
BUN: 18 mg/dL (ref 6–20)
CO2: 25 mmol/L (ref 22–32)
CREATININE: 0.86 mg/dL (ref 0.61–1.24)
Calcium: 9 mg/dL (ref 8.9–10.3)
Chloride: 110 mmol/L (ref 101–111)
GFR calc non Af Amer: >60 mL/min (ref >60)
Glucose, Bld: 106 mg/dL — ABNORMAL HIGH (ref 65–99)
Potassium: 3.9 mmol/L (ref 3.5–5.1)
Sodium: 141 mmol/L (ref 135–145)
Total Bilirubin: 0.7 mg/dL (ref 0.3–1.2)
Total Protein: 6.8 g/dL (ref 6.5–8.1)

## 2015-06-05 LAB — PROTIME-INR
INR: 0.97
PROTHROMBIN TIME: 13.1 s (ref 11.4–15.0)

## 2015-06-05 LAB — PHOSPHORUS: PHOSPHORUS: 4.4 mg/dL (ref 2.5–4.6)

## 2015-06-05 LAB — APTT: APTT: 30 s (ref 24–36)

## 2015-06-05 LAB — MAGNESIUM: Magnesium: 2.5 mg/dL — ABNORMAL HIGH (ref 1.7–2.4)

## 2015-06-05 MED ORDER — IOHEXOL 240 MG/ML SOLN
50.0000 mL | INTRAMUSCULAR | Status: AC
  Administered 2015-06-05: 50 mL via ORAL

## 2015-06-05 MED ORDER — DIPHENHYDRAMINE HCL 50 MG/ML IJ SOLN: 12.5000 mg | Freq: Four times a day (QID) | INTRAMUSCULAR | Status: DC | PRN

## 2015-06-05 MED ORDER — DIPHENHYDRAMINE HCL 12.5 MG/5ML PO ELIX: 12.5000 mg | ORAL_SOLUTION | Freq: Four times a day (QID) | ORAL | Status: DC | PRN

## 2015-06-05 MED ORDER — HYDRALAZINE HCL 20 MG/ML IJ SOLN: 10.0000 mg | INTRAMUSCULAR | Status: DC | PRN

## 2015-06-05 MED ORDER — LACTATED RINGERS IV SOLN
INTRAVENOUS | Status: DC
  Administered 2015-06-05 – 2015-06-06 (×3): via INTRAVENOUS

## 2015-06-05 MED ORDER — PANTOPRAZOLE SODIUM 40 MG IV SOLR
40.0000 mg | Freq: Every day | INTRAVENOUS | Status: DC
  Administered 2015-06-05: 40 mg via INTRAVENOUS
  Filled 2015-06-05: qty 40

## 2015-06-05 MED ORDER — ONDANSETRON HCL 4 MG/2ML IJ SOLN: 4.0000 mg | Freq: Four times a day (QID) | INTRAMUSCULAR | Status: DC | PRN

## 2015-06-05 MED ORDER — IOHEXOL 300 MG/ML  SOLN
100.0000 mL | Freq: Once | INTRAMUSCULAR | Status: AC | PRN
  Administered 2015-06-05: 100 mL via INTRAVENOUS

## 2015-06-05 MED ORDER — MORPHINE SULFATE (PF) 4 MG/ML IV SOLN
4.0000 mg | INTRAVENOUS | Status: DC | PRN
  Administered 2015-06-05: 4 mg via INTRAVENOUS
  Filled 2015-06-05: qty 1

## 2015-06-05 MED ORDER — ONDANSETRON 4 MG PO TBDP: 4.0000 mg | ORAL_TABLET | Freq: Four times a day (QID) | ORAL | Status: DC | PRN

## 2015-06-05 MED ORDER — INFLUENZA VAC SPLIT QUAD 0.5 ML IM SUSY: 0.5000 mL | PREFILLED_SYRINGE | INTRAMUSCULAR | Status: DC

## 2015-06-05 NOTE — Progress Notes (Signed)
Patient ID: Parker Bishop, male   DOB: 1956/03/24, 60 y.o.   MRN: [id number]  CC: ABDOMINAL PAIN  HPI Parker Bishop is a 60 y.o. male  Presents to clinic for follow-up from recent emergency room visits for abdominal pain. Patient states he's been having upper abdominal pain that shoots down his entire right side and into his right groin. The pain has been progressively getting worse. Currently patient is unable to have full voiding of urine or stool due to the pain. Patient is also noticed a bulge from the upper aspect of his midline incision that appears to be getting worse. Is off to the right and above the umbilicus. He denies any current nausea or vomiting but has had nausea with the pain. Patient denies any fevers, chills , chest pain, shortness of breath at this time. He has a significant recent history of small bowel resection secondary to perforated small bowel diverticulum.  HPI  Past Medical History  Diagnosis Date  . Diverticulitis   . Bowel perforation (HCC)   . Arthritis     Past Surgical History  Procedure Laterality Date  . Laparotomy N/A 04/09/2015    Procedure: EXPLORATORY LAPAROTOMY;  Surgeon: Lattie Haw, MD;  Location: [facility] ORS;  Service: General;  Laterality: N/A;  . Bowel resection N/A 04/09/2015    Procedure: SMALL BOWEL RESECTION;  Surgeon: Lattie Haw, MD;  Location: [facility] ORS;  Service: General;  Laterality: N/A;  . Colon surgery      Family History  Problem Relation Age of Onset  . Arthritis Mother   . Diverticulitis Father     Social History Social History  Substance Use Topics  . Smoking status: Former Smoker    Quit date: 04/23/1989  . Smokeless tobacco: Never Used  . Alcohol Use: No    No Known Allergies  No current outpatient prescriptions on file.   No current facility-administered medications for this visit.     Review of Systems A  thighpoint review of systems was asked and was negative except for the  Findings documented  in the history of present illness  Physical Exam Blood pressure 191/101, pulse 88, temperature 98.1 F (36.7 C), temperature source Oral, height 5\' 3"  (1.6 m), weight 81.647 kg (180 lb). CONSTITUTIONAL:  No acute distress. EYES: Pupils are equal, round, and reactive to light, Sclera are non-icteric. EARS, NOSE, MOUTH AND THROAT: The oropharynx is clear. The oral mucosa is pink and moist. Hearing is intact to voice. LYMPH NODES:  Lymph nodes in the neck are normal. RESPIRATORY:  Lungs are clear. There is normal respiratory effort, with equal breath sounds bilaterally, and without pathologic use of accessory muscles. CARDIOVASCULAR: Heart is regular without murmurs, gallops, or rubs. GI: The abdomen is  Large, mildly distended, tender to palpation along the entire right side. There is a palpable  Bulge above the umbilicus on the patient's right side. Patient also has a very tender but reducible right inguinal hernia. The right anal hernia is quite large on physical exam. Patient also with tenderness and palpation of the midepigastrium  wayfrom the bulges. There are no palpable masses. There is no hepatosplenomegaly. There are normal bowel sounds in all quadrants. GU: Rectal deferred.   MUSCULOSKELETAL: Normal muscle strength and tone. No cyanosis or edema.   SKIN: Turgor is good and there are no pathologic skin lesions or ulcers. NEUROLOGIC: Motor and sensation is grossly normal. Cranial nerves are grossly intact. PSYCH:  Oriented to person, place and time. Affect is normal.  Data Reviewed  patient with multiple ER visits since last seen in clinic but no labs on his visit 2 weeks ago. Only recent imaging was of a groin ultrasound which shows the already known hernia that was seen on the CT scan from 3 months ago. I have personally reviewed the patient's imaging, laboratory findings and medical records.    Assessment     abdominal pain     Plan     60 year old male with a significant  history of recent small bowel diverticular perforation. Now with slowly worsening abdominal pain with decreasing ability to void and have bowel movements. Patient being very tender on exam to the right of midline and to the right groin with definite symptomatic hernias. Question is whether or not his previous small bowel surgery is a part of his abdominal pain. Plan is to bring in the hospital under observation to Dr. Excell Seltzerooper who is currently covering the inpatient service. We will obtain labs, images and provide with pain control and hydration. She may require surgery in the next 24-48 hours secondary to his symptoms.      Time spent with the patient was 45 minutes, with more than 50% of the time spent in face-to-face education, counseling and care coordination.     Ricarda Frameharles Sabre Romberger, MD FACS General Surgeon 06/05/2015, 11:05 AM

## 2015-06-05 NOTE — H&P (Signed)
CC: ABDOMINAL PAIN  HPI  Parker Bishop is a 60 y.o. male Presents to clinic for follow-up from recent emergency room visits for abdominal pain. Patient states he's been having upper abdominal pain that shoots down his entire right side and into his right groin. The pain has been progressively getting worse. Currently patient is unable to have full voiding of urine or stool due to the pain. Patient is also noticed a bulge from the upper aspect of his midline incision that appears to be getting worse. Is off to the right and above the umbilicus. He denies any current nausea or vomiting but has had nausea with the pain. Patient denies any fevers, chills , chest pain, shortness of breath at this time. He has a significant recent history of small bowel resection secondary to perforated small bowel diverticulum.  HPI  Past Medical History   Diagnosis  Date   .  Diverticulitis    .  Bowel perforation (HCC)    .  Arthritis     Past Surgical History   Procedure  Laterality  Date   .  Laparotomy  N/A  04/09/2015     Procedure: EXPLORATORY LAPAROTOMY; Surgeon: Lattie Hawichard E Cooper, MD; Location: [facility] ORS; Service: General; Laterality: N/A;   .  Bowel resection  N/A  04/09/2015     Procedure: SMALL BOWEL RESECTION; Surgeon: Lattie Hawichard E Cooper, MD; Location: [facility] OR[facility]; Service: General; Laterality: N/A;   .  Colon surgery      Family History   Problem  Relation  Age of Onset   .  Arthritis  Mother    .  Diverticulitis  Father     Social History  Social History   Substance Use Topics   .  Smoking status:  Former Smoker     Quit date:  04/23/1989   .  Smokeless tobacco:  Never Used   .  Alcohol Use:  No    No Known Allergies  No current outpatient prescriptions on file.    No current facility-administered medications for this visit.    Review of Systems  A thighpoint review of systems was asked and was negative except for the Findings documented in the history of present illness  Physical Exam   Blood pressure 191/101, pulse 88, temperature 98.1 F (36.7 C), temperature source Oral, height 5\' 3"  (1.6 m), weight 81.647 kg (180 lb).  CONSTITUTIONAL: No acute distress.  EYES: Pupils are equal, round, and reactive to light, Sclera are non-icteric.  EARS, NOSE, MOUTH AND THROAT: The oropharynx is clear. The oral mucosa is pink and moist. Hearing is intact to voice.  LYMPH NODES: Lymph nodes in the neck are normal.  RESPIRATORY: Lungs are clear. There is normal respiratory effort, with equal breath sounds bilaterally, and without pathologic use of accessory muscles.  CARDIOVASCULAR: Heart is regular without murmurs, gallops, or rubs.  GI: The abdomen is Large, mildly distended, tender to palpation along the entire right side. There is a palpable Bulge above the umbilicus on the patient's right side. Patient also has a very tender but reducible right inguinal hernia. The right anal hernia is quite large on physical exam. Patient also with tenderness and palpation of the midepigastrium wayfrom the bulges. There are no palpable masses. There is no hepatosplenomegaly. There are normal bowel sounds in all quadrants.  GU: Rectal deferred.  MUSCULOSKELETAL: Normal muscle strength and tone. No cyanosis or edema.  SKIN: Turgor is good and there are no pathologic skin lesions or ulcers.  NEUROLOGIC:  Motor and sensation is grossly normal. Cranial nerves are grossly intact.  PSYCH: Oriented to person, place and time. Affect is normal.  Data Reviewed  patient with multiple ER visits since last seen in clinic but no labs on his visit 2 weeks ago. Only recent imaging was of a groin ultrasound which shows the already known hernia that was seen on the CT scan from 3 months ago.  I have personally reviewed the patient's imaging, laboratory findings and medical records.  Assessment   abdominal pain   Plan   60 year old male with a significant history of recent small bowel diverticular perforation. Now with  slowly worsening abdominal pain with decreasing ability to void and have bowel movements. Patient being very tender on exam to the right of midline and to the right groin with definite symptomatic hernias. Question is whether or not his previous small bowel surgery is a part of his abdominal pain. Plan is to bring in the hospital under observation to Dr. Excell Seltzer who is currently covering the inpatient service. We will obtain labs, images and provide with pain control and hydration. She may require surgery in the next 24-48 hours secondary to his symptoms.   Time spent with the patient was 45 minutes, with more than 50% of the time spent in face-to-face education, counseling and care coordination.  Ricarda Frame, MD FACS General Surgeon  06/05/2015, 11:05 AM

## 2015-06-05 NOTE — Progress Notes (Signed)
Patient reexamined and discussed with Dr. Tonita CongWoodham. Patient's son acts as interpreter. Patient's pain is much improved and almost gone. He questions why he is swollen to the right side of his incision. When questioned he points to the right side of his incision basically from the costal margin to the umbilicus. He does not point to the right groin where he has a known anal inguinal hernia.  Vital signs are stable Abdomen is soft and nondistended somewhat tender diffusely over the right side without erythema or drainage from the incision which is well-healed. Right angle hernia is present which is reducible and nontender calves are nontender  CT scan is reviewed and compared to prior CT scan essentially unchanged there is no sign of an abdominal wall hernia other than the right inguinal hernia which is not acutely incarcerated.  White Blood cell count is normal  Assessment plan no obvious abdominal wall hernia. He has a right anal hernia which I do not believe is involved in this pain complex. He has pain to the right side of his abdominal abdomen and I will obtain an ultrasound of his gallbladder at this point to see if this could be the etiology. This is discussed with he and his family.

## 2015-06-05 NOTE — Progress Notes (Signed)
Discussed with Dr. Adonis Huguenin Patient met and discussed with family. Vital signs reviewed Patient appears somewhat uncomfortable Abdomen is soft nondistended minimally tender in the epigastrium no palpable hernia is noted. Large right scrotal hernia is noted which is nontender and reducible. Nontender calves  Labs reviewed CT not yet performed and will be reviewed personally.  Depending on the results the CT scan we may proceed with laparoscopy for the right inguinal hernia and repair of the ventral hernia depending on the CT reviews discussed with the family.

## 2015-06-06 ENCOUNTER — Observation Stay: Payer: Commercial Managed Care - PPO

## 2015-06-06 DIAGNOSIS — R1011 Right upper quadrant pain: Secondary | ICD-10-CM | POA: Diagnosis not present

## 2015-06-06 DIAGNOSIS — R1010 Upper abdominal pain, unspecified: Secondary | ICD-10-CM | POA: Diagnosis not present

## 2015-06-06 LAB — COMPREHENSIVE METABOLIC PANEL
ALBUMIN: 3.7 g/dL (ref 3.5–5.0)
ALT: 21 U/L (ref 17–63)
ANION GAP: 4 — AB (ref 5–15)
AST: 18 U/L (ref 15–41)
Alkaline Phosphatase: 68 U/L (ref 38–126)
BUN: 10 mg/dL (ref 6–20)
CO2: 28 mmol/L (ref 22–32)
Calcium: 8.8 mg/dL — ABNORMAL LOW (ref 8.9–10.3)
Chloride: 106 mmol/L (ref 101–111)
Creatinine, Ser: 0.77 mg/dL (ref 0.61–1.24)
GFR calc Af Amer: >60 mL/min (ref >60)
GFR calc non Af Amer: >60 mL/min (ref >60)
GLUCOSE: 104 mg/dL — AB (ref 65–99)
POTASSIUM: 3.7 mmol/L (ref 3.5–5.1)
SODIUM: 138 mmol/L (ref 135–145)
Total Bilirubin: 0.5 mg/dL (ref 0.3–1.2)
Total Protein: 6.6 g/dL (ref 6.5–8.1)

## 2015-06-06 LAB — CBC WITH DIFFERENTIAL/PLATELET
Basophils Absolute: 0.1 10*3/uL (ref 0–0.1)
Basophils Relative: 2 %
EOS ABS: 0.2 10*3/uL (ref 0–0.7)
EOS PCT: 4 %
HCT: 39.8 % — ABNORMAL LOW (ref 40.0–52.0)
Hemoglobin: 13.6 g/dL (ref 13.0–18.0)
Lymphocytes Relative: 26 %
Lymphs Abs: 1.1 10*3/uL (ref 1.0–3.6)
MCH: 29.5 pg (ref 26.0–34.0)
MCHC: 34.3 g/dL (ref 32.0–36.0)
MCV: 86 fL (ref 80.0–100.0)
MONO ABS: 0.3 10*3/uL (ref 0.2–1.0)
MONOS PCT: 7 %
NEUTROS PCT: 61 %
Neutro Abs: 2.6 10*3/uL (ref 1.4–6.5)
PLATELETS: 217 10*3/uL (ref 150–440)
RBC: 4.62 MIL/uL (ref 4.40–5.90)
RDW: 13.7 % (ref 11.5–14.5)
WBC: 4.2 10*3/uL (ref 3.8–10.6)

## 2015-06-06 LAB — LIPASE, BLOOD: Lipase: 24 U/L (ref 11–51)

## 2015-06-06 MED ORDER — OMEPRAZOLE 40 MG PO CPDR: 40.0000 mg | DELAYED_RELEASE_CAPSULE | Freq: Every day | ORAL | Status: DC

## 2015-06-06 MED ORDER — HYDROCODONE-ACETAMINOPHEN 5-300 MG PO TABS: 1.0000 | ORAL_TABLET | ORAL | Status: DC | PRN

## 2015-06-06 NOTE — Progress Notes (Signed)
Alert and oriented. Discharge instructions given to patient with interpreter. Denies pain and discomfort to abdomen.. No signs of acute distress.

## 2015-06-06 NOTE — Discharge Instructions (Signed)
Resume normal activity Regular diet Follow up Dr Excell Seltzerooper two weeks

## 2015-06-06 NOTE — Discharge Summary (Signed)
Physician Discharge Summary  Patient ID: Parker Bishop MRN: [id number] DOB/AGE: [birth year] 60 y.o.  Admit date: 06/05/2015 Discharge date: 06/06/2015   Discharge Diagnoses:  Active Problems:   Abdominal pain   Procedures:none  Hospital Course: This is a patient admitted from the office by Dr. Tonita CongWoodham with concerns over incarcerated ventral hernia to the right of midline with the patient was most tender. Workup of that area failed to identify an incarcerated hernia or any sort of rent in the abdominal wall. He has a known inguinal hernia with on the right which is not incarcerated and nontender and not in the area of this tenderness that was to the right of midline somewhat in the right upper quadrant. On admission exam showed tenderness diffusely in the right upper quadrant without peritoneal signs are negative Murphy sign but a workup showed a negative CAT scan and a negative ultrasound no gallstones no cholecystitis and no hernia and his white count remained normal. His pain is completely resolved at this point so he will be discharged in stable condition on omeprazole and Percocet as necessary although he has no pain at this point. I recommended that he follow up in my office in 2 weeks and we will discuss inguinal hernia repair on an elective basis. This is communicated to him with a family member as a Equities traderinterpreter.  Consults: none  Disposition: 01-Home or Self Care     Medication List    TAKE these medications        Hydrocodone-Acetaminophen 5-300 MG Tabs  Commonly known as:  VICODIN  Take 1 tablet by mouth every 4 (four) hours as needed.     omeprazole 40 MG capsule  Commonly known as:  PRILOSEC  Take 1 capsule (40 mg total) by mouth daily.           Follow-up Information    Follow up with Dionne Miloichard Shaconda Hajduk, MD In 2 weeks.   Specialty:  Surgery   Contact information:   [address] [place] [id number] [phone]       Lattie Hawichard E Sheamus Hasting, MD,  FACS

## 2015-06-06 NOTE — Progress Notes (Signed)
CC: Right-sided abdominal pain Subjective: Patient feels better today his son is present and axes interpreter he is having no nausea vomiting fevers or chills he does complain of right leg pain thigh leg and foot. He states that his abdominal pain is improved.  Objective: Vital signs in last 24 hours: Temp:  [97.3 F (36.3 C)-98.1 F (36.7 C)] 97.3 F (36.3 C) (01/12 0617) Pulse Rate:  [67-88] 67 (01/12 0617) Resp:  [19-20] 19 (01/12 0114) BP: (135-191)/(80-101) 141/83 mmHg (01/12 0617) SpO2:  [95 %-99 %] 98 % (01/12 0617) Weight:  [175 lb 14.4 oz (79.788 kg)-180 lb (81.647 kg)] 175 lb 14.4 oz (79.788 kg) (01/11 1209) Last BM Date: 06/05/15  Intake/Output from previous day: 01/11 0701 - 01/12 0700 In: 841.6 [I.V.:841.6] Out: 1100 [Urine:1100] Intake/Output this shift:    Physical exam:  Abdomen is soft and minimally tender in the right side of the incision and right upper quadrant with a negative Murphy sign calves are nontender Homans sign is negative no edema no bony abnormality of the right leg.  Lab Results: CBC   Recent Labs  06/05/15 1312 06/06/15 0555  WBC 5.5 4.2  HGB 13.6 13.6  HCT 39.4* 39.8*  PLT 228 217   BMET  Recent Labs  06/05/15 1312 06/06/15 0555  NA 141 138  K 3.9 3.7  CL 110 106  CO2 25 28  GLUCOSE 106* 104*  BUN 18 10  CREATININE 0.86 0.77  CALCIUM 9.0 8.8*   PT/INR  Recent Labs  06/05/15 1312  LABPROT 13.1  INR 0.97   ABG No results for input(s): PHART, HCO3 in the last 72 hours.  Invalid input(s): PCO2, PO2  Studies/Results: Ct Abdomen Pelvis W Contrast  06/05/2015  CLINICAL DATA:  Upper abdominal pain into the right groin. EXAM: CT ABDOMEN AND PELVIS WITH CONTRAST TECHNIQUE: Multidetector CT imaging of the abdomen and pelvis was performed using the standard protocol following bolus administration of intravenous contrast. CONTRAST:  100mL OMNIPAQUE IOHEXOL 300 MG/ML  SOLN COMPARISON:  04/18/2015 FINDINGS: Lower chest and  abdominal wall: Fatty right inguinal hernia also with bladder apex directed into the deep inguinal ring. Hepatobiliary: No focal liver abnormality.No evidence of biliary obstruction or stone. Pancreas: Unremarkable. Spleen: Unremarkable. Adrenals/Urinary Tract: Negative adrenals. No hydronephrosis or stone. Unremarkable bladder. Reproductive:Chronic prostate enlargement with central gland projecting into the bladder base. Superimposed dystrophic calcification. Stomach/Bowel: No obstruction. No inflammatory change. On series 2, image 55 a narrowed area in the sigmoid colon has a normal appearance on recent scan and is considered collapse. Colonic and small bowel diverticulosis. Stable appearance of enteroenteric anastomosis in the epigastric region. Vascular/Lymphatic: No acute vascular abnormality. No mass or adenopathy. Peritoneal: No ascites or pneumoperitoneum. Musculoskeletal: Osteopenia.  Usual degenerative changes. IMPRESSION: 1. No acute finding. 2. Right inguinal hernia containing fat and bladder apex. 3. Enlarged prostate. Electronically Signed   By: Marnee SpringJonathon  Watts M.D.   On: 06/05/2015 16:05    Anti-infectives: Anti-infectives    None      Assessment/Plan:  Ultrasound is pending. Labs are personally reviewed. We'll reassess after ultrasound is complete but I doubt this patient has acute cholecystitis. The etiology of his pain is unclear. Family asked about repairing his inguinal hernia and I would not do that at this hospitalization as it is not the cause of his leg pain nor is it the cause of his abdominal pain and repairing this at this point is not indicated.   Lattie Hawichard E Roshell Brigham, MD, FACS  06/06/2015

## 2015-06-06 NOTE — Progress Notes (Signed)
Follow up on patient. He has no pain at this point his pain is completely resolved. He has no nausea vomiting fevers or chills. No pain in his groin either  Vital signs are stable Abdomen is soft nondistended nontympanitic and nontender there is no erythema or drainage around the midline wound scar. Right angle hernia is nontender nonincarcerated completely reducible Abs are nontender right leg is nontender with no inhibition to range of motion passively.  Ultrasound was negative showing no gallstones and no sign of acute cholecystitis  With a normal white blood cell count negative ultrasound negative CT scan and resolution of his symptoms I will send him home today to see in the office in 2 weeks to discuss elective repair of an asymptomatic right inguinal hernia. Me indicated to him with a family member who acted as an Equities traderinterpreter.

## 2015-06-14 ENCOUNTER — Ambulatory Visit (INDEPENDENT_AMBULATORY_CARE_PROVIDER_SITE_OTHER): Payer: Commercial Managed Care - PPO | Admitting: Surgery

## 2015-06-14 ENCOUNTER — Encounter: Payer: Self-pay | Admitting: Surgery

## 2015-06-14 VITALS — BP 164/105 | HR 73 | Temp 98.4°F | Wt 178.0 lb

## 2015-06-14 DIAGNOSIS — K4090 Unilateral inguinal hernia, without obstruction or gangrene, not specified as recurrent: Secondary | ICD-10-CM

## 2015-06-14 NOTE — Progress Notes (Signed)
Outpatient Surgical Follow Up  06/14/2015  Parker Bishop is an 60 y.o. male.   CC right inguinal hernia  HPI: This patient with a right inguinal hernia which is causing him some pain. He had recently been hospitalized for pain in the right peri-umbilical area but that resolved he has no nausea vomiting fevers or chills.  Past Medical History  Diagnosis Date  . Diverticulitis   . Bowel perforation (HCC)   . Arthritis     Past Surgical History  Procedure Laterality Date  . Laparotomy N/A 04/09/2015    Procedure: EXPLORATORY LAPAROTOMY;  Surgeon: Lattie Haw, MD;  Location: [facility] ORS;  Service: General;  Laterality: N/A;  . Bowel resection N/A 04/09/2015    Procedure: SMALL BOWEL RESECTION;  Surgeon: Lattie Haw, MD;  Location: [facility] ORS;  Service: General;  Laterality: N/A;  . Colon surgery      Family History  Problem Relation Age of Onset  . Arthritis Mother   . Diverticulitis Father     Social History:  reports that he quit smoking about 26 years ago. He has never used smokeless tobacco. He reports that he does not drink alcohol or use illicit drugs.  Allergies: No Known Allergies  Medications reviewed.   Review of Systems:   Review of Systems  Constitutional: Negative.   HENT: Negative.   Eyes: Negative.   Respiratory: Negative.   Cardiovascular: Negative.   Gastrointestinal: Negative.   Genitourinary: Negative.   Musculoskeletal: Negative.   Skin: Negative.   Neurological: Negative.   Endo/Heme/Allergies: Negative.   Psychiatric/Behavioral: Negative.      Physical Exam:  BP 164/105 mmHg  Pulse 73  Temp(Src) 98.4 F (36.9 C) (Oral)  Wt 178 lb (80.74 kg)  Physical Exam  Constitutional: He is oriented to person, place, and time and well-developed, well-nourished, and in no distress. No distress.  HENT:  Head: Normocephalic and atraumatic.  Eyes: Pupils are equal, round, and reactive to light. Right eye exhibits no discharge. Left eye  exhibits no discharge. No scleral icterus.  Neck: Normal range of motion.  Cardiovascular: Normal rate, regular rhythm and normal heart sounds.   Pulmonary/Chest: Effort normal. No respiratory distress. He has no wheezes.  Abdominal: Soft. He exhibits no distension. There is no tenderness. There is no rebound and no guarding.  Genitourinary: Penis normal. No discharge found.  Large right inguinal hernia which is partially reducible and minimally tender all testicles  Musculoskeletal: Normal range of motion. He exhibits no edema.  Lymphadenopathy:    He has no cervical adenopathy.  Neurological: He is alert and oriented to person, place, and time.  Skin: Skin is warm and dry. No rash noted. He is not diaphoretic. No erythema.  Psychiatric: Mood and affect normal.  Vitals reviewed.     No results found for this or any previous visit (from the past 48 hour(s)). No results found.  Assessment/Plan:  Abdominal pain has resolved. Right inguinal hernia present and requires repair I discussed with him via an interpreter the rationale for surgery the options of observation risk bleeding infection recurrence ischemic orchitis chronic pain syndrome and neuroma this reviewed for him via an interpreter the understood and agreed to proceed  Lattie Haw, MD, FACS

## 2015-06-19 ENCOUNTER — Telehealth: Payer: Self-pay

## 2015-06-19 NOTE — Telephone Encounter (Signed)
Called patient and couldn't leave him a voicemail since it was only ringing with no options of leaving a message.  Patient's Office Pre-Op appointment: 07/04/2015 at 9:00 AM Surgery Date: 07/11/2015

## 2015-06-21 NOTE — Telephone Encounter (Signed)
Called patient again and nobody answered. Wasn't able to leave a voice message. I will send patient a letter in Spanish letting him know about his Pre-Op appointment and Surgery date.

## 2015-07-04 ENCOUNTER — Encounter
Admission: RE | Admit: 2015-07-04 | Discharge: 2015-07-04 | Disposition: A | Discharge status: Home / Self Care | Payer: Commercial Managed Care - PPO | Source: Ambulatory Visit | Attending: Surgery | Admitting: Surgery

## 2015-07-04 DIAGNOSIS — K449 Diaphragmatic hernia without obstruction or gangrene: Secondary | ICD-10-CM | POA: Insufficient documentation

## 2015-07-04 DIAGNOSIS — Z01812 Encounter for preprocedural laboratory examination: Secondary | ICD-10-CM | POA: Insufficient documentation

## 2015-07-04 HISTORY — DX: Polyneuropathy, unspecified: G62.9

## 2015-07-04 HISTORY — DX: Tremor, unspecified: R25.1

## 2015-07-04 LAB — CBC WITH DIFFERENTIAL/PLATELET
BASOS PCT: 1 %
Basophils Absolute: 0.1 10*3/uL (ref 0–0.1)
Eosinophils Absolute: 0.4 10*3/uL (ref 0–0.7)
Eosinophils Relative: 6 %
HEMATOCRIT: 41.8 % (ref 40.0–52.0)
HEMOGLOBIN: 14.4 g/dL (ref 13.0–18.0)
LYMPHS ABS: 1.4 10*3/uL (ref 1.0–3.6)
Lymphocytes Relative: 20 %
MCH: 29.2 pg (ref 26.0–34.0)
MCHC: 34.5 g/dL (ref 32.0–36.0)
MCV: 84.8 fL (ref 80.0–100.0)
MONOS PCT: 7 %
Monocytes Absolute: 0.5 10*3/uL (ref 0.2–1.0)
NEUTROS ABS: 4.7 10*3/uL (ref 1.4–6.5)
NEUTROS PCT: 66 %
Platelets: 246 10*3/uL (ref 150–440)
RBC: 4.93 MIL/uL (ref 4.40–5.90)
RDW: 13.6 % (ref 11.5–14.5)
WBC: 7 10*3/uL (ref 3.8–10.6)

## 2015-07-04 LAB — BASIC METABOLIC PANEL
ANION GAP: 10 (ref 5–15)
BUN: 14 mg/dL (ref 6–20)
CALCIUM: 9 mg/dL (ref 8.9–10.3)
CHLORIDE: 104 mmol/L (ref 101–111)
CO2: 24 mmol/L (ref 22–32)
Creatinine, Ser: 0.98 mg/dL (ref 0.61–1.24)
GFR calc non Af Amer: >60 mL/min (ref >60)
Glucose, Bld: 103 mg/dL — ABNORMAL HIGH (ref 65–99)
Potassium: 3.6 mmol/L (ref 3.5–5.1)
Sodium: 138 mmol/L (ref 135–145)

## 2015-07-04 NOTE — Patient Instructions (Signed)
  Your procedure is scheduled on: 07/11/15 Thurs Report to Day Surgery.2nd floor medical mall To find out your arrival time please call [phone] between 1PM - 3PM on 07/10/15 Wed  Remember: Instructions that are not followed completely may result in serious medical risk, up to and including death, or upon the discretion of your surgeon and anesthesiologist your surgery may need to be rescheduled.    __x__ 1. Do not eat food or drink liquids after midnight. No gum chewing or hard candies.     ____ 2. No Alcohol for 24 hours before or after surgery.   ____ 3. Bring all medications with you on the day of surgery if instructed.    ____ 4. Notify your doctor if there is any change in your medical condition     (cold, fever, infections).     Do not wear jewelry, make-up, hairpins, clips or nail polish.  Do not wear lotions, powders, or perfumes. You may wear deodorant.  Do not shave 48 hours prior to surgery. Men may shave face and neck.  Do not bring valuables to the hospital.    [facility] is not responsible for any belongings or valuables.               Contacts, dentures or bridgework may not be worn into surgery.  Leave your suitcase in the car. After surgery it may be brought to your room.  For patients admitted to the hospital, discharge time is determined by your                treatment team.   Patients discharged the day of surgery will not be allowed to drive home.   Please read over the following fact sheets that you were given:      ____ Take these medicines the morning of surgery with A SIP OF WATER:    1. none  2.   3.   4.  5.  6.  ____ Fleet Enema (as directed)   ____ Use CHG Soap as directed  ____ Use inhalers on the day of surgery  ____ Stop metformin 2 days prior to surgery    ____ Take 1/2 of usual insulin dose the night before surgery and none on the morning of surgery.   ____ Stop Coumadin/Plavix/aspirin on   _x___ Stop Anti-inflammatories on  No ibuprofen or aspirin til after surgery, May take Tylenol as needed   ____ Stop supplements until after surgery.    ____ Bring C-Pap to the hospital.

## 2015-07-11 ENCOUNTER — Ambulatory Visit: Payer: Commercial Managed Care - PPO | Admitting: Anesthesiology

## 2015-07-11 ENCOUNTER — Ambulatory Visit
Admission: RE | Admit: 2015-07-11 | Discharge: 2015-07-11 | Disposition: A | Discharge status: Home / Self Care | Payer: Commercial Managed Care - PPO | Source: Ambulatory Visit | Attending: Surgery | Admitting: Surgery

## 2015-07-11 ENCOUNTER — Encounter: Payer: Self-pay | Admitting: *Deleted

## 2015-07-11 ENCOUNTER — Encounter
Admission: RE | Disposition: A | Discharge status: Home / Self Care | Payer: Self-pay | Source: Ambulatory Visit | Attending: Surgery

## 2015-07-11 DIAGNOSIS — K4090 Unilateral inguinal hernia, without obstruction or gangrene, not specified as recurrent: Secondary | ICD-10-CM | POA: Diagnosis present

## 2015-07-11 DIAGNOSIS — Z8261 Family history of arthritis: Secondary | ICD-10-CM | POA: Insufficient documentation

## 2015-07-11 DIAGNOSIS — Z87891 Personal history of nicotine dependence: Secondary | ICD-10-CM | POA: Insufficient documentation

## 2015-07-11 DIAGNOSIS — R251 Tremor, unspecified: Secondary | ICD-10-CM | POA: Insufficient documentation

## 2015-07-11 DIAGNOSIS — Z9049 Acquired absence of other specified parts of digestive tract: Secondary | ICD-10-CM | POA: Insufficient documentation

## 2015-07-11 DIAGNOSIS — Z8379 Family history of other diseases of the digestive system: Secondary | ICD-10-CM | POA: Diagnosis not present

## 2015-07-11 DIAGNOSIS — K469 Unspecified abdominal hernia without obstruction or gangrene: Secondary | ICD-10-CM | POA: Insufficient documentation

## 2015-07-11 DIAGNOSIS — M1990 Unspecified osteoarthritis, unspecified site: Secondary | ICD-10-CM | POA: Insufficient documentation

## 2015-07-11 HISTORY — PX: INGUINAL HERNIA REPAIR: SHX194

## 2015-07-11 SURGERY — REPAIR, HERNIA, INGUINAL, LAPAROSCOPIC
Anesthesia: General | Laterality: Right | Wound class: Clean

## 2015-07-11 MED ORDER — LACTATED RINGERS IV SOLN
INTRAVENOUS | Status: DC
  Administered 2015-07-11: 07:00:00 via INTRAVENOUS

## 2015-07-11 MED ORDER — ESMOLOL HCL 100 MG/10ML IV SOLN
INTRAVENOUS | Status: DC | PRN
  Administered 2015-07-11: 20 mg via INTRAVENOUS

## 2015-07-11 MED ORDER — OXYCODONE-ACETAMINOPHEN 5-325 MG PO TABS: 1.0000 | ORAL_TABLET | ORAL | Status: DC | PRN

## 2015-07-11 MED ORDER — BUPIVACAINE-EPINEPHRINE (PF) 0.25% -1:200000 IJ SOLN
INTRAMUSCULAR | Status: AC
  Filled 2015-07-11: qty 30

## 2015-07-11 MED ORDER — FAMOTIDINE 20 MG PO TABS
20.0000 mg | ORAL_TABLET | Freq: Once | ORAL | Status: AC
  Administered 2015-07-11: 20 mg via ORAL

## 2015-07-11 MED ORDER — DEXAMETHASONE SODIUM PHOSPHATE 10 MG/ML IJ SOLN
INTRAMUSCULAR | Status: DC | PRN
  Administered 2015-07-11: 10 mg via INTRAVENOUS

## 2015-07-11 MED ORDER — LABETALOL HCL 5 MG/ML IV SOLN
INTRAVENOUS | Status: DC | PRN
  Administered 2015-07-11 (×2): 2.5 mg via INTRAVENOUS

## 2015-07-11 MED ORDER — LABETALOL HCL 5 MG/ML IV SOLN
5.0000 mg | INTRAVENOUS | Status: DC | PRN
  Administered 2015-07-11: 5 mg via INTRAVENOUS

## 2015-07-11 MED ORDER — CEFAZOLIN SODIUM-DEXTROSE 2-3 GM-% IV SOLR
2.0000 g | INTRAVENOUS | Status: AC
  Administered 2015-07-11: 2 g via INTRAVENOUS

## 2015-07-11 MED ORDER — FENTANYL CITRATE (PF) 100 MCG/2ML IJ SOLN
INTRAMUSCULAR | Status: AC
  Administered 2015-07-11: 25 ug via INTRAVENOUS
  Filled 2015-07-11: qty 2

## 2015-07-11 MED ORDER — CHLORHEXIDINE GLUCONATE 4 % EX LIQD: 1.0000 "application " | Freq: Once | CUTANEOUS | Status: DC

## 2015-07-11 MED ORDER — PROPOFOL 10 MG/ML IV BOLUS
INTRAVENOUS | Status: DC | PRN
  Administered 2015-07-11: 150 mg via INTRAVENOUS

## 2015-07-11 MED ORDER — ONDANSETRON HCL 4 MG/2ML IJ SOLN: 4.0000 mg | Freq: Once | INTRAMUSCULAR | Status: DC | PRN

## 2015-07-11 MED ORDER — ROCURONIUM BROMIDE 100 MG/10ML IV SOLN
INTRAVENOUS | Status: DC | PRN
  Administered 2015-07-11: 40 mg via INTRAVENOUS
  Administered 2015-07-11: 10 mg via INTRAVENOUS

## 2015-07-11 MED ORDER — FENTANYL CITRATE (PF) 100 MCG/2ML IJ SOLN
25.0000 ug | INTRAMUSCULAR | Status: DC | PRN
  Administered 2015-07-11 (×2): 25 ug via INTRAVENOUS

## 2015-07-11 MED ORDER — SUGAMMADEX SODIUM 200 MG/2ML IV SOLN
INTRAVENOUS | Status: DC | PRN
  Administered 2015-07-11: 163.2 mg via INTRAVENOUS

## 2015-07-11 MED ORDER — LABETALOL HCL 5 MG/ML IV SOLN
INTRAVENOUS | Status: AC
  Administered 2015-07-11: 5 mg via INTRAVENOUS
  Filled 2015-07-11: qty 4

## 2015-07-11 MED ORDER — FENTANYL CITRATE (PF) 100 MCG/2ML IJ SOLN
INTRAMUSCULAR | Status: DC | PRN
  Administered 2015-07-11: 100 ug via INTRAVENOUS
  Administered 2015-07-11 (×2): 50 ug via INTRAVENOUS

## 2015-07-11 MED ORDER — MIDAZOLAM HCL 2 MG/2ML IJ SOLN
INTRAMUSCULAR | Status: DC | PRN
  Administered 2015-07-11: 2 mg via INTRAVENOUS

## 2015-07-11 MED ORDER — ONDANSETRON HCL 4 MG/2ML IJ SOLN
INTRAMUSCULAR | Status: DC | PRN
  Administered 2015-07-11: 4 mg via INTRAVENOUS

## 2015-07-11 MED ORDER — BUPIVACAINE-EPINEPHRINE (PF) 0.25% -1:200000 IJ SOLN
INTRAMUSCULAR | Status: DC | PRN
  Administered 2015-07-11: 30 mL via PERINEURAL

## 2015-07-11 SURGICAL SUPPLY — 41 items
ADHESIVE MASTISOL STRL (MISCELLANEOUS) ×3 IMPLANT
BLADE SURG SZ11 CARB STEEL (BLADE) ×3 IMPLANT
CANNULA DILATOR 12 W/SLV (CANNULA) IMPLANT
CANNULA DILATOR 12MM W/SLV (CANNULA)
CATH TRAY 16F METER LATEX (MISCELLANEOUS) ×3 IMPLANT
CHLORAPREP W/TINT 26ML (MISCELLANEOUS) ×3 IMPLANT
CLOSURE WOUND 1/2 X4 (GAUZE/BANDAGES/DRESSINGS) ×1
DISSECT BALLN SPACEMKR OVL PDB (BALLOONS) ×3
DISSECT BALLN SPACEMKR RND PDB (MISCELLANEOUS) ×3
DISSECTOR BALLN SPCMKR OVL PDB (BALLOONS) ×1 IMPLANT
DISSECTOR BALLN SPCMKR RND PDB (MISCELLANEOUS) ×1 IMPLANT
GAUZE SPONGE NON-WVN 2X2 STRL (MISCELLANEOUS) ×1 IMPLANT
GLOVE BIO SURGEON STRL SZ8 (GLOVE) ×12 IMPLANT
GLOVE EXAM NITRILE PF MED BLUE (GLOVE) ×3 IMPLANT
GOWN STRL REUS W/ TWL LRG LVL3 (GOWN DISPOSABLE) ×2 IMPLANT
GOWN STRL REUS W/TWL LRG LVL3 (GOWN DISPOSABLE) ×4
IRRIGATION STRYKERFLOW (MISCELLANEOUS) IMPLANT
IRRIGATOR STRYKERFLOW (MISCELLANEOUS)
LABEL OR SOLS (LABEL) ×3 IMPLANT
MESH HERNIA 4X6 PROLITE RECT (Mesh General) ×1 IMPLANT
MESH POLY 4X6 (Mesh General) ×2 IMPLANT
NDL SAFETY 22GX1.5 (NEEDLE) ×3 IMPLANT
NS IRRIG 500ML POUR BTL (IV SOLUTION) ×3 IMPLANT
PACK LAP CHOLECYSTECTOMY (MISCELLANEOUS) ×3 IMPLANT
SCISSORS METZENBAUM CVD 33 (INSTRUMENTS) IMPLANT
SEAL FOR SCOPE WARMER C3101 (MISCELLANEOUS) IMPLANT
SPONGE LAP 18X18 5 PK (GAUZE/BANDAGES/DRESSINGS) ×3 IMPLANT
SPONGE VERSALON 2X2 STRL (MISCELLANEOUS) ×2
STRIP CLOSURE SKIN 1/2X4 (GAUZE/BANDAGES/DRESSINGS) ×2 IMPLANT
SURGILUBE 2OZ TUBE FLIPTOP (MISCELLANEOUS) IMPLANT
SUT ETHIBOND 0 (SUTURE) ×3 IMPLANT
SUT MNCRL 4-0 (SUTURE) ×4
SUT MNCRL 4-0 27XMFL (SUTURE) ×2
SUT VICRYL 0 AB UR-6 (SUTURE) ×3 IMPLANT
SUTURE MNCRL 4-0 27XMF (SUTURE) ×2 IMPLANT
TACKER 5MM HERNIA 3.5CML NAB (ENDOMECHANICALS) ×3 IMPLANT
TROCAR 5MM SINGLE VERSAONE (TROCAR) ×6 IMPLANT
TROCAR BALLN 10M OMST10SB SPAC (TROCAR) IMPLANT
TROCAR Z-THREAD FIOS 11X100 BL (TROCAR) IMPLANT
TUBING INSUFFLATOR HI FLOW (MISCELLANEOUS) ×3 IMPLANT
WATER STERILE IRR 1000ML POUR (IV SOLUTION) IMPLANT

## 2015-07-11 NOTE — Anesthesia Procedure Notes (Signed)
Procedure Name: Intubation Date/Time: 07/11/2015 7:29 AM Performed by: Omer Jack Pre-anesthesia Checklist: Patient identified, Patient being monitored, Timeout performed, Emergency Drugs available and Suction available Patient Re-evaluated:Patient Re-evaluated prior to inductionOxygen Delivery Method: Circle system utilized Preoxygenation: Pre-oxygenation with 100% oxygen Intubation Type: IV induction Ventilation: Mask ventilation without difficulty Laryngoscope Size: Miller and 2 Grade View: Grade I Tube type: Oral Tube size: 7.0 mm Number of attempts: 1 Placement Confirmation: ETT inserted through vocal cords under direct vision,  positive ETCO2 and breath sounds checked- equal and bilateral Secured at: 21 cm Tube secured with: Tape Dental Injury: Teeth and Oropharynx as per pre-operative assessment

## 2015-07-11 NOTE — Op Note (Signed)
Laparoscopic Inguinal Hernia Repair  Reason Faries  07/11/2015  Pre-operative Diagnosis: Right Inguinal Hernia  Post-operative Diagnosis: Right  Inguinal hernia  Procedure: Laparoscopic preperitoneal repair of right inguinal hernia   Surgeon: Adah Salvage. Excell Seltzer, MD FACS  Anesthesia: Gen. with endotracheal tube  Assistant: Surgical tech  Procedure Details  The patient was seen again in the Holding Room. The benefits, complications, treatment options, and expected outcomes were discussed with the patient. The risks of bleeding, infection, recurrence of symptoms, failure to resolve symptoms, recurrence of hernia, ischemic orchitis, chronic pain syndrome or neuroma, were discussed again. The likelihood of improving the patient's symptoms with return to their baseline status is good.  The patient and/or family concurred with the proposed plan, giving informed consent. An interpreter was utilized for this discussion and I reemphasized the risks of bleeding infection recurrence and ecchymosis and swollen tender testicle. The patient was taken to Operating Room, identified as [facility] and the procedure verified as Laparoscopic Inguinal Hernia Repair. Laterality confirmed.  A Time Out was held and the above information confirmed.  Prior to the induction of general anesthesia, antibiotic prophylaxis was administered. VTE prophylaxis was in place. General endotracheal anesthesia was then administered and tolerated well. A Foley catheter was placed by the nursing staff. After the induction, the abdomen was prepped with Chloraprep and draped in the sterile fashion. The patient was positioned in the supine position.  Local anesthetic  was injected into the skin near the umbilicus and an incision made. An incision was made and dissection down to the rectus fascia was performed. The fascia was incised and the muscle retracted laterally. The Covidien dissecting balloon was placed followed by the  structural balloon. The preperitoneal space was insufflated and under direct vision 2 midline 5 mm ports were placed.  Dissection was performed to delineate Lisabeth Mian's ligament and the lateral extent of dissection was determined. The nerve on the lateral abdominal wall was identified and kept in view at all times. The cord was skeletonized of the indirect sac and cord lipoma which was retracted cephalad.   Once this was complete, a laterally scissored Atrium mesh was placed into the preperitoneal space. It was held in place with the Covidien tacking device avoiding the area of the nerve. Once assuring that the hernia was completely repaired and adequately covered, the preperitoneal space was desufflated under direct vision. There was no sign of peritoneal rent and no sign of bowel intrusion towards the mesh.  Once assuring that hemostasis was adequate the ports were removed and a figure-of-eight 0 Vicryl suture was placed at the fascial edges. 4-0 subcuticular Monocryl was used at all skin edges. Steri-Strips and Mastisol and sterile dressings were placed.  Patient tolerated the procedure well. There were no complications. He was taken to the recovery room in stable condition to be discharged to the care of his family and follow-up in 10 days.    Findings: Large long-standing indirect sac week floor                       Sina Sumpter E. Excell Seltzer, MD, FACS

## 2015-07-11 NOTE — Transfer of Care (Signed)
Immediate Anesthesia Transfer of Care Note  Patient: Parker Bishop  Procedure(s) Performed: Procedure(s): LAPAROSCOPIC INGUINAL HERNIA (Right)  Patient Location: PACU  Anesthesia Type:General  Level of Consciousness: awake, alert  and oriented  Airway & Oxygen Therapy: Patient Spontanous Breathing and Patient connected to face mask oxygen  Post-op Assessment: Report given to RN and Post -op Vital signs reviewed and stable  Post vital signs: Reviewed and stable  Last Vitals:  Filed Vitals:   07/11/15 0639 07/11/15 0832  BP: 156/89 186/107  Pulse: 73 89  Temp: 36.9 C 36.6 C  Resp: 16 16    Complications: No apparent anesthesia complications

## 2015-07-11 NOTE — Discharge Instructions (Signed)
Remove dressing in 24 hours. May shower in 24 hours. Leave paper strips in place. Resume all home medications. Follow-up with Dr. Excell Seltzer in 10 days.        CIRUGIA AMBULATORIA       Instruccionnes de alta    Date Franco Nones) 07/11/2015   1.  Las drogas que se Dispensing optician en su cuerpo [employer], asi      que por las proximas 24 horas usted no debe:   Conducir Field seismologist) un automovil   Hacer ninguna decision legal   Tomar ninguna bebida alcoholica  2.  A) [place] puede comenzar una dieta regular.  Es mejor que hoy empiece con                    liquidos y gradualmente anada [address]       B) Puede comer cualquier comida que desee pero es mejor empezar con liquidos,               luego sopitas con galletas saladas y gradualmente llegar a las comidas solidas.  3.  Por favor avise a su medico inmediatamente si usted tiene algun sangrado anormal,       tiene dificultad con la respiracion, enrojecimiento y Engineer, mining en el sitio de la cirugia,     [place], fiebro o dolor que se alivia con [place].  4.  A) Su visita posoperatoria (despues de su operacion) es con el  Dr. Excell Seltzer  Date 07/29/2015                    Time 9:45 am        B)  Por favor llame para hacer la cita posoperatoria.  5.  Istrucciones especificas :

## 2015-07-11 NOTE — Progress Notes (Signed)
Preoperative Review   Patient is met in the preoperative holding area. The history is reviewed in the chart and with the patient. I personally reviewed the options and rationale as well as the risks of this procedure that have been previously discussed with the patient. All questions asked by the patient and/or family were answered to their satisfaction.  His attestation was all done during the preop visit in preop area via an interpreter risks were reviewed in detail and the patient was marked on the right side  Patient agrees to proceed with this procedure at this time.  Florene Glen M.D. FACS

## 2015-07-11 NOTE — Anesthesia Preprocedure Evaluation (Signed)
Anesthesia Evaluation  Patient identified by MRN, date of birth, ID band Patient awake    Reviewed: Allergy & Precautions, H&P , NPO status , Patient's Chart, lab work & pertinent test results, reviewed documented beta blocker date and time   Airway Mallampati: III  TM Distance: >3 FB Neck ROM: full    Dental  (+) Teeth Intact, Chipped, Poor Dentition   Pulmonary neg pulmonary ROS, former smoker,    Pulmonary exam normal        Cardiovascular negative cardio ROS Normal cardiovascular exam Rhythm:regular Rate:Normal     Neuro/Psych negative neurological ROS  negative psych ROS   GI/Hepatic negative GI ROS, Neg liver ROS,   Endo/Other  negative endocrine ROS  Renal/GU negative Renal ROS  negative genitourinary   Musculoskeletal   Abdominal   Peds  Hematology negative hematology ROS (+)   Anesthesia Other Findings Past Medical History:   Diverticulitis                                               Bowel perforation (HCC)                                      Arthritis                                                    Tremors of nervous system                                    Neuropathy (HCC)                                           Past Surgical History:   LAPAROTOMY                                      N/A 04/09/2015     Comment:Procedure: EXPLORATORY LAPAROTOMY;  Surgeon:               Lattie Haw, MD;  Location: [facility] ORS;                Service: General;  Laterality: N/A;   BOWEL RESECTION                                 N/A 04/09/2015     Comment:Procedure: SMALL BOWEL RESECTION;  Surgeon:               Lattie Haw, MD;  Location: [facility] ORS;                Service: General;  Laterality: N/A;   COLON SURGERY  BMI    Body Mass Index   30.88 kg/m 2     Reproductive/Obstetrics negative OB ROS                              Anesthesia Physical Anesthesia Plan  ASA: II  Anesthesia Plan: General ETT   Post-op Pain Management:    Induction:   Airway Management Planned:   Additional Equipment:   Intra-op Plan:   Post-operative Plan:   Informed Consent: I have reviewed the patients History and Physical, chart, labs and discussed the procedure including the risks, benefits and alternatives for the proposed anesthesia with the patient or authorized representative who has indicated his/her understanding and acceptance.   Dental Advisory Given  Plan Discussed with: CRNA  Anesthesia Plan Comments:         Anesthesia Quick Evaluation

## 2015-07-12 ENCOUNTER — Encounter: Payer: Self-pay | Admitting: Surgery

## 2015-07-12 NOTE — Anesthesia Postprocedure Evaluation (Signed)
Anesthesia Post Note  Patient: Kilo Eshelman  Procedure(s) Performed: Procedure(s) (LRB): LAPAROSCOPIC INGUINAL HERNIA (Right)  Patient location during evaluation: PACU Anesthesia Type: General Level of consciousness: awake and alert Pain management: pain level controlled Vital Signs Assessment: post-procedure vital signs reviewed and stable Respiratory status: spontaneous breathing, nonlabored ventilation, respiratory function stable and patient connected to nasal cannula oxygen Cardiovascular status: blood pressure returned to baseline and stable Postop Assessment: no signs of nausea or vomiting Anesthetic complications: no    Last Vitals:  Filed Vitals:   07/11/15 0935 07/11/15 0942  BP: 149/93 141/92  Pulse: 69 74  Temp: 36.7 C   Resp: 16 16    Last Pain:  Filed Vitals:   07/12/15 0838  PainSc: 0-No pain                 Yevette Edwards

## 2015-07-29 ENCOUNTER — Ambulatory Visit: Payer: Commercial Managed Care - PPO | Admitting: Surgery

## 2015-07-30 ENCOUNTER — Encounter: Payer: Self-pay | Admitting: Surgery

## 2015-07-31 ENCOUNTER — Encounter: Payer: Self-pay | Admitting: Surgery

## 2015-07-31 ENCOUNTER — Ambulatory Visit (INDEPENDENT_AMBULATORY_CARE_PROVIDER_SITE_OTHER): Payer: Commercial Managed Care - PPO | Admitting: Surgery

## 2015-07-31 VITALS — BP 159/102 | HR 73 | Temp 98.3°F | Wt 179.0 lb

## 2015-07-31 DIAGNOSIS — K4090 Unilateral inguinal hernia, without obstruction or gangrene, not specified as recurrent: Secondary | ICD-10-CM

## 2015-07-31 NOTE — Patient Instructions (Addendum)
Por favor vaya a ver su doctor/doctora por la razon de que usted sigue con la presion alta. Yo no quiero que tenga un para cardiaco asi que vaya ver su doctor.  Por favor llamenos para decirnos si su presion ya esta regulado con medicamentos para yo darle de alta.

## 2015-07-31 NOTE — Progress Notes (Signed)
Outpatient postop visit  07/31/2015  Parker Bishop is an 60 y.o. male.    Procedure:  Laparoscopic right inguinal hernia repair    HPI: patient status post right angle hernia repair using laparoscopic preperitoneal approach.  Patient describes bilateral lower extremity numbness and tingling especially in his feet. The symptoms are in the bottom of his feet and the anterior shins only  Is no complaints concerning his abdomen or groins  Of note his blood pressure is extremely elevated again today  Medications reviewed.    Physical Exam:  BP 159/102 mmHg  Pulse 73  Temp(Src) 98.3 F (36.8 C) (Oral)  Wt 179 lb (81.194 kg)    PE:  Diastolic blood pressure over 100  Abdomen is soft and nontender wounds are healing well without erythema drainage or ecchymosis groin exam is normal.  Calves are nontender there is no edema he has a normal gait    Assessment/Plan:   very high diastolic blood pressure I will have him see a primary care physician for this. He also has unusual symptoms not related to his hernia repair which was unilateral and should not affect both feet. I will ask him to see his primary care doctor for those problems as well to follow up on an as-needed basis.  Lattie Hawichard E Jaycen Vercher, MD, FACS

## 2016-01-21 ENCOUNTER — Ambulatory Visit (INDEPENDENT_AMBULATORY_CARE_PROVIDER_SITE_OTHER): Payer: Commercial Managed Care - PPO | Admitting: Surgery

## 2016-01-21 ENCOUNTER — Encounter: Payer: Self-pay | Admitting: Surgery

## 2016-01-21 VITALS — BP 156/91 | HR 74 | Temp 98.4°F | Ht 64.0 in | Wt 179.0 lb

## 2016-01-21 DIAGNOSIS — K4090 Unilateral inguinal hernia, without obstruction or gangrene, not specified as recurrent: Secondary | ICD-10-CM

## 2016-01-21 NOTE — Progress Notes (Signed)
Outpatient Surgical Follow Up  01/21/2016  Parker Bishop is an 60 y.o. male.   CC: Right groin pain  HPI: This patient who had a right inguinal hernia repaired via laparoscopic preperitoneal approach in February 2017. He describes no abdominal pain at this time but has a constellation of symptoms related to the following: Weakness in his hands and knee pain bilaterally leg pain bilaterally ankle pain bilaterally and bilateral foot burning. He also has bilateral shoulder pain and neck pain and headaches but no nausea or vomiting.  This interview was held with the assistance of a qualified interpreter. At his last visit when he was complaining about foot pain I reviewed this with him and suggested he see his primary care physician. He saw a physician at [facility] and was told that this was "normal". This was all discussed via the interpreter.  Past Medical History:  Diagnosis Date  . Arthritis   . Bowel perforation (HCC)   . Diverticulitis   . Neuropathy (HCC)   . Tremors of nervous system     Past Surgical History:  Procedure Laterality Date  . BOWEL RESECTION N/A 04/09/2015   Procedure: SMALL BOWEL RESECTION;  Surgeon: Lattie Haw, MD;  Location: [facility] ORS;  Service: General;  Laterality: N/A;  . COLON SURGERY    . INGUINAL HERNIA REPAIR Right 07/11/2015   Procedure: LAPAROSCOPIC INGUINAL HERNIA;  Surgeon: Lattie Haw, MD;  Location: [facility] ORS;  Service: General;  Laterality: Right;  . LAPAROTOMY N/A 04/09/2015   Procedure: EXPLORATORY LAPAROTOMY;  Surgeon: Lattie Haw, MD;  Location: [facility] ORS;  Service: General;  Laterality: N/A;    Family History  Problem Relation Age of Onset  . Arthritis Mother   . Diverticulitis Father     Social History:  reports that he quit smoking about 26 years ago. He has never used smokeless tobacco. He reports that he does not drink alcohol or use drugs.  Allergies: No Known Allergies  Medications reviewed.   Review of  Systems:   Review of Systems  Constitutional: Negative for chills and fever.  Eyes: Negative.   Respiratory: Negative.   Cardiovascular: Negative.   Gastrointestinal: Negative for abdominal pain, blood in stool, constipation, diarrhea, heartburn, melena, nausea and vomiting.  Genitourinary: Negative.   Musculoskeletal: Positive for back pain, joint pain, myalgias and neck pain. Negative for falls.  Skin: Positive for itching and rash.  Neurological: Positive for focal weakness and headaches. Negative for dizziness, tingling, tremors, seizures and loss of consciousness.  Endo/Heme/Allergies: Negative.   Psychiatric/Behavioral: Negative.      Physical Exam:  There were no vitals taken for this visit.  Physical Exam  Constitutional: He is oriented to person, place, and time and well-developed, well-nourished, and in no distress. No distress.  Appears completely comfortable  HENT:  Head: Normocephalic and atraumatic.  Eyes: Pupils are equal, round, and reactive to light. Right eye exhibits no discharge. Left eye exhibits no discharge. No scleral icterus.  Neck: Normal range of motion. Neck supple. No thyromegaly present.  Cardiovascular: Normal rate, regular rhythm and normal heart sounds.   Pulmonary/Chest: Effort normal and breath sounds normal. No respiratory distress. He has no wheezes. He has no rales.  Abdominal: Soft. He exhibits no distension. There is no tenderness. There is no rebound and no guarding.  Scars healed no erythema no drainage nontender no hernia  Musculoskeletal: Normal range of motion. He exhibits no edema, tenderness or deformity.  Lymphadenopathy:    He has no cervical  adenopathy.  Neurological: He is alert and oriented to person, place, and time. Gait normal.  Skin: Skin is warm and dry. No rash noted. He is not diaphoretic. No erythema.  Vitals reviewed.     No results found for this or any previous visit (from the past 48 hour(s)). No results  found.  Assessment/Plan:  Status post laparoscopic preperitoneal repair of a right inguinal hernia in February 2017. This patient's constellation of symptoms are all completely unrelated to his prior abdominal surgeries. I have reviewed with him via the interpreter the need to find a qualified internal medicine specialist to assist him in determining the cause of his multiple complaints which mostly are related to either myalgias or joint pain or neurologic symptoms. I reminded him that I'm a surgeon and not capable of properly diagnosing these issues and I would not recommend any testing until he could be seen by a internal medicine specialist or neurologist. Attempts at scheduling him with a new primary care physician was performed by the nursing staff. Washings were answered for him and his family they understood and agreed with this plan and also voiced understanding that it as a Careers advisersurgeon I was not able to handle the multiple maladies that he is experiencing.  Lattie Hawichard E Khris Jansson, MD, FACS

## 2016-01-21 NOTE — Patient Instructions (Addendum)
I will work on getting you another Primary Physician and I will call you with the details of this appointment.

## 2016-02-09 ENCOUNTER — Encounter: Payer: Self-pay | Admitting: Emergency Medicine

## 2016-02-09 ENCOUNTER — Emergency Department
Admission: EM | Admit: 2016-02-09 | Discharge: 2016-02-09 | Disposition: A | Discharge status: Home / Self Care | Payer: Commercial Managed Care - PPO | Attending: Emergency Medicine | Admitting: Emergency Medicine

## 2016-02-09 ENCOUNTER — Emergency Department: Payer: Commercial Managed Care - PPO

## 2016-02-09 DIAGNOSIS — M542 Cervicalgia: Secondary | ICD-10-CM | POA: Insufficient documentation

## 2016-02-09 DIAGNOSIS — Z87891 Personal history of nicotine dependence: Secondary | ICD-10-CM | POA: Insufficient documentation

## 2016-02-09 DIAGNOSIS — I10 Essential (primary) hypertension: Secondary | ICD-10-CM | POA: Diagnosis not present

## 2016-02-09 DIAGNOSIS — R51 Headache: Secondary | ICD-10-CM | POA: Diagnosis not present

## 2016-02-09 DIAGNOSIS — R519 Headache, unspecified: Secondary | ICD-10-CM

## 2016-02-09 HISTORY — DX: Essential (primary) hypertension: I10

## 2016-02-09 LAB — CBC
HCT: 39.4 % — ABNORMAL LOW (ref 40.0–52.0)
Hemoglobin: 14 g/dL (ref 13.0–18.0)
MCH: 30.2 pg (ref 26.0–34.0)
MCHC: 35.6 g/dL (ref 32.0–36.0)
MCV: 85 fL (ref 80.0–100.0)
Platelets: 250 K/uL (ref 150–440)
RBC: 4.63 MIL/uL (ref 4.40–5.90)
RDW: 13.2 % (ref 11.5–14.5)
WBC: 4.5 K/uL (ref 3.8–10.6)

## 2016-02-09 LAB — BASIC METABOLIC PANEL WITH GFR
Anion gap: 4 — ABNORMAL LOW (ref 5–15)
BUN: 16 mg/dL (ref 6–20)
CO2: 23 mmol/L (ref 22–32)
Calcium: 8.5 mg/dL — ABNORMAL LOW (ref 8.9–10.3)
Chloride: 110 mmol/L (ref 101–111)
Creatinine, Ser: 0.76 mg/dL (ref 0.61–1.24)
GFR calc Af Amer: >60 mL/min
GFR calc non Af Amer: >60 mL/min
Glucose, Bld: 107 mg/dL — ABNORMAL HIGH (ref 65–99)
Potassium: 3.9 mmol/L (ref 3.5–5.1)
Sodium: 137 mmol/L (ref 135–145)

## 2016-02-09 MED ORDER — HYDROCODONE-ACETAMINOPHEN 5-325 MG PO TABS: 1.0000 | ORAL_TABLET | ORAL | 0 refills | Status: DC | PRN

## 2016-02-09 MED ORDER — METOCLOPRAMIDE HCL 5 MG/ML IJ SOLN
10.0000 mg | Freq: Once | INTRAMUSCULAR | Status: AC
  Administered 2016-02-09: 10 mg via INTRAVENOUS
  Filled 2016-02-09: qty 2

## 2016-02-09 MED ORDER — SODIUM CHLORIDE 0.9 % IV BOLUS (SEPSIS)
1000.0000 mL | Freq: Once | INTRAVENOUS | Status: AC
  Administered 2016-02-09: 1000 mL via INTRAVENOUS

## 2016-02-09 MED ORDER — DIPHENHYDRAMINE HCL 50 MG/ML IJ SOLN
50.0000 mg | Freq: Once | INTRAMUSCULAR | Status: AC
  Administered 2016-02-09: 50 mg via INTRAVENOUS
  Filled 2016-02-09: qty 1

## 2016-02-09 MED ORDER — KETOROLAC TROMETHAMINE 30 MG/ML IJ SOLN
30.0000 mg | Freq: Once | INTRAMUSCULAR | Status: AC
  Administered 2016-02-09: 30 mg via INTRAVENOUS
  Filled 2016-02-09: qty 1

## 2016-02-09 NOTE — ED Notes (Signed)
Patient transported to MRI 

## 2016-02-09 NOTE — ED Provider Notes (Signed)
[facility] Emergency Department Provider Note  Time seen: 7:59 AM  I have reviewed the triage vital signs and the nursing notes.   HISTORY  Chief Complaint Headache    HPI Parker Bishop is a 60 y.o. male with a past medical history of hypertension, who presents to the emergency department with neck pain. According to the patient over the past 2 months he has been experiencing significant neck pain, worse with movement. He has been seen by his primary care doctor placed on medications, but has had no relief. He states over the past 2-3 days he is now having pain and weakness in his bilateral arms. Denies any fever. States he does not have a headache, he only has posterior neck pain. He has not been sleeping well for the past several days due to the pain. As a secondary complaint the patient also states 1 month of intermittent cough, denies fever, denies sputum production. Describes as neck pain as moderate, dull pain, morbid sharp pain when he moves.  Past Medical History:  Diagnosis Date  . Arthritis   . Bowel perforation (HCC)   . Diverticulitis   . Hypertension   . Neuropathy (HCC)   . Tremors of nervous system     Patient Active Problem List   Diagnosis Date Noted  . Hernia of abdominal cavity   . Abdominal pain 06/05/2015  . Right inguinal hernia 06/05/2015  . Perforated viscus 04/09/2015    Past Surgical History:  Procedure Laterality Date  . BOWEL RESECTION N/A 04/09/2015   Procedure: SMALL BOWEL RESECTION;  Surgeon: Lattie Haw, MD;  Location: [facility] ORS;  Service: General;  Laterality: N/A;  . COLON SURGERY    . INGUINAL HERNIA REPAIR Right 07/11/2015   Procedure: LAPAROSCOPIC INGUINAL HERNIA;  Surgeon: Lattie Haw, MD;  Location: [facility] ORS;  Service: General;  Laterality: Right;  . LAPAROTOMY N/A 04/09/2015   Procedure: EXPLORATORY LAPAROTOMY;  Surgeon: Lattie Haw, MD;  Location: [facility] ORS;  Service: General;  Laterality: N/A;     Prior to Admission medications   Medication Sig Start Date End Date Taking? Authorizing Provider  acetaminophen (TYLENOL) 325 MG tablet Take 650 mg by mouth every 6 (six) hours as needed.    Historical Provider, MD    No Known Allergies  Family History  Problem Relation Age of Onset  . Arthritis Mother   . Diverticulitis Father     Social History Social History  Substance Use Topics  . Smoking status: Former Smoker    Quit date: 04/23/1989  . Smokeless tobacco: Never Used  . Alcohol use No    Review of Systems Constitutional: Negative for fever. Cardiovascular: Negative for chest pain. Respiratory: Negative for shortness of breath. Gastrointestinal: Negative for abdominal pain Musculoskeletal: Positive for neck pain. Neurological: Patient states decreased strength and pain in bilateral arms and hands. 10-point ROS otherwise negative.  ____________________________________________   PHYSICAL EXAM:  VITAL SIGNS: ED Triage Vitals  Enc Vitals Group     BP 02/09/16 0727 (!) 158/111     Pulse Rate 02/09/16 0727 72     Resp 02/09/16 0727 20     Temp 02/09/16 0727 98.4 F (36.9 C)     Temp Source 02/09/16 0727 Oral     SpO2 02/09/16 0727 98 %     Weight 02/09/16 0728 182 lb (82.6 kg)     Height 02/09/16 0728 5\' 2"  (1.575 m)     Head Circumference --  Peak Flow --      Pain Score 02/09/16 0729 10     Pain Loc --      Pain Edu? --      Excl. in GC? --     Constitutional: Alert and oriented. Well appearing and in no distress. Eyes: Normal exam ENT   Head: Normocephalic and atraumatic.   Mouth/Throat: Mucous membranes are moist. Cardiovascular: Normal rate, regular rhythm. No murmur Respiratory: Normal respiratory effort without tachypnea nor retractions. Breath sounds are clear  Gastrointestinal: Soft and nontender. No distention.  Musculoskeletal: Moderate cervical spine tenderness to palpation, no deformity or ecchymosis. Moderate cervical  paraspinal tenderness palpation. No thoracic tenderness. Neurologic:  Normal speech and language. 4+/5 grip strength bilaterally, but equal. Sensation intact and equal bilaterally. 4+/5 strength in lower extremities without drift, equal bilaterally. Skin:  Skin is warm, dry and intact.  Psychiatric: Mood and affect are normal.   ____________________________________________   RADIOLOGY  MRI is negative  ____________________________________________   INITIAL IMPRESSION / ASSESSMENT AND PLAN / ED COURSE  Pertinent labs & imaging results that were available during my care of the patient were reviewed by me and considered in my medical decision making (see chart for details).  Patient presents the emergency department with neck pain ongoing for 2 months, worse over the past 3 days now with arm symptoms bilaterally. On neurologic exam the patient appears intact, decreased strength in arms and legs, difficult to ascertain whether its lack of effort versus true weakness, regardless the strength is equal bilaterally. Sensation is equal bilaterally. Patient is tender on exam in the cervical spine and paraspinal muscles. We will treat the patient's discomfort, obtain a chest x-ray given the cough 1 month, and obtain an MRI of the cervical spine to rule out spinal stenosis. Patient denies any trauma.  MRI of the cervical spine is negative. We'll discharge with pain medication and PCP follow-up. Patient agreeable.  ____________________________________________   FINAL CLINICAL IMPRESSION(S) / ED DIAGNOSES  Neck pain    Minna AntisKevin Palma Buster, MD 02/09/16 1302

## 2016-02-09 NOTE — ED Notes (Addendum)
Patient transported to XR. 

## 2016-02-09 NOTE — ED Triage Notes (Signed)
C/O neck and head pain x 2 months.  [facility] has been [facility] but symptoms have not improved.  Over the past two days, pain is worse and patient now complains of bilateral arm and leg pain and has not been able to sleep for the past 2 days.  Has been taking butalbital, naproxen, and lisinopril

## 2016-04-02 DIAGNOSIS — M79671 Pain in right foot: Secondary | ICD-10-CM | POA: Insufficient documentation

## 2016-04-02 DIAGNOSIS — M79672 Pain in left foot: Secondary | ICD-10-CM

## 2016-04-02 DIAGNOSIS — I10 Essential (primary) hypertension: Secondary | ICD-10-CM | POA: Insufficient documentation

## 2016-04-02 DIAGNOSIS — M7989 Other specified soft tissue disorders: Secondary | ICD-10-CM

## 2016-04-02 DIAGNOSIS — M79674 Pain in right toe(s): Secondary | ICD-10-CM | POA: Insufficient documentation

## 2016-04-10 DIAGNOSIS — R7303 Prediabetes: Secondary | ICD-10-CM | POA: Insufficient documentation

## 2016-04-10 DIAGNOSIS — R972 Elevated prostate specific antigen [PSA]: Secondary | ICD-10-CM | POA: Insufficient documentation

## 2016-04-13 ENCOUNTER — Encounter: Payer: Self-pay | Admitting: Urology

## 2016-04-13 ENCOUNTER — Ambulatory Visit: Payer: Commercial Managed Care - PPO | Admitting: Urology

## 2016-04-13 VITALS — BP 138/84 | HR 82 | Ht 65.0 in | Wt 178.0 lb

## 2016-04-13 DIAGNOSIS — N401 Enlarged prostate with lower urinary tract symptoms: Secondary | ICD-10-CM

## 2016-04-13 DIAGNOSIS — N138 Other obstructive and reflux uropathy: Secondary | ICD-10-CM | POA: Diagnosis not present

## 2016-04-13 DIAGNOSIS — R351 Nocturia: Secondary | ICD-10-CM | POA: Diagnosis not present

## 2016-04-13 DIAGNOSIS — R972 Elevated prostate specific antigen [PSA]: Secondary | ICD-10-CM | POA: Diagnosis not present

## 2016-04-13 LAB — BLADDER SCAN AMB NON-IMAGING

## 2016-04-13 MED ORDER — TAMSULOSIN HCL 0.4 MG PO CAPS: 0.4000 mg | ORAL_CAPSULE | Freq: Every day | ORAL | 11 refills | Status: DC

## 2016-04-13 NOTE — Progress Notes (Signed)
04/13/2016 2:51 PM   Bertram Mordecai MaesSanchez 10-26-1955 [id number]  Referring provider: Leotis ShamesJasmine Singh, MD [address] [facility] [place] , [place] [address]  Chief Complaint  Patient presents with  . Elevated PSA    New Patient    HPI: Mr Mordecai MaesSanchez is a 60yo here for evaluation of elevated PSA and BPH.  IPSS is 14 with bother of 5.  His biggest complaint is frequency, urgency and nocturia 5x per night for over 1 year NO hematuria or dysuria. No feeling of incomplete emptying. No fluid within 2 hours of bedtime. No caffeine within 6-7 hours of bedtime. No OSA No other associated symptoms. No exacerbating/allevaiting events.  PVR 32.  PSA was 4.37 on 04/02/2016. No previous PSA on record. No hx of UTI or prostate infections.     PMH: Past Medical History:  Diagnosis Date  . Arthritis   . Bowel perforation (HCC)   . Diverticulitis   . Hypertension   . Neuropathy (HCC)   . Tremors of nervous system     Surgical History: Past Surgical History:  Procedure Laterality Date  . BOWEL RESECTION N/A 04/09/2015   Procedure: SMALL BOWEL RESECTION;  Surgeon: Lattie Hawichard E Cooper, MD;  Location: [facility] ORS;  Service: General;  Laterality: N/A;  . COLON SURGERY    . INGUINAL HERNIA REPAIR Right 07/11/2015   Procedure: LAPAROSCOPIC INGUINAL HERNIA;  Surgeon: Lattie Hawichard E Cooper, MD;  Location: [facility] ORS;  Service: General;  Laterality: Right;  . LAPAROTOMY N/A 04/09/2015   Procedure: EXPLORATORY LAPAROTOMY;  Surgeon: Lattie Hawichard E Cooper, MD;  Location: [facility] ORS;  Service: General;  Laterality: N/A;    Home Medications:    Medication List       Accurate as of 04/13/16  2:51 PM. Always use your most recent med list.          meloxicam 7.5 MG tablet Commonly known as:  MOBIC take 1 to 2 tablets by mouth once daily if needed for pain   predniSONE 20 MG tablet Commonly known as:  DELTASONE take 2 tablets by mouth once daily for 3 days then 1 tablet once daily for 4 days        Allergies: No Known Allergies  Family History: Family History  Problem Relation Age of Onset  . Arthritis Mother   . Diverticulitis Father   . Kidney cancer Father   . Prostate cancer Neg Hx     Social History:  reports that he quit smoking about 26 years ago. He has never used smokeless tobacco. He reports that he does not drink alcohol or use drugs.  ROS: UROLOGY Frequent Urination?: Yes Hard to postpone urination?: Yes Burning/pain with urination?: No Get up at night to urinate?: Yes Leakage of urine?: Yes Urine stream starts and stops?: Yes Trouble starting stream?: No Do you have to strain to urinate?: No Blood in urine?: No Urinary tract infection?: No Sexually transmitted disease?: No Injury to kidneys or bladder?: No Painful intercourse?: No Weak stream?: Yes Erection problems?: No Penile pain?: No  Gastrointestinal Nausea?: No Vomiting?: No Indigestion/heartburn?: Yes Diarrhea?: No Constipation?: Yes  Constitutional Fever: No Night sweats?: No Weight loss?: No Fatigue?: No  Skin Skin rash/lesions?: No Itching?: No  Eyes Blurred vision?: No Double vision?: No  Ears/Nose/Throat Sore throat?: Yes Sinus problems?: Yes  Hematologic/Lymphatic Swollen glands?: No Easy bruising?: Yes  Cardiovascular Leg swelling?: Yes Chest pain?: Yes  Respiratory Cough?: No Shortness of breath?: No  Endocrine Excessive thirst?: Yes  Musculoskeletal Back pain?: No  Joint pain?: Yes  Neurological Headaches?: Yes Dizziness?: Yes  Psychologic Depression?: Yes Anxiety?: Yes  Physical Exam: BP 138/84   Pulse 82   Ht 5\' 5"  (1.651 m)   Wt 80.7 kg (178 lb)   BMI 29.62 kg/m   Constitutional:  Alert and oriented, No acute distress. HEENT: [place] AT, moist mucus membranes.  Trachea midline, no masses. Cardiovascular: No clubbing, cyanosis, or edema. Respiratory: Normal respiratory effort, no increased work of breathing. GI: Abdomen is soft,  nontender, nondistended, no abdominal masses GU: No CVA tenderness. Uncircumcised phallus. No masses/lesions on penis/testes/scrotum. Prostate 50g smooth, non induration, no nodules. Skin: No rashes, bruises or suspicious lesions. Lymph: No cervical or inguinal adenopathy. Neurologic: Grossly intact, no focal deficits, moving all 4 extremities. Psychiatric: Normal mood and affect.  Laboratory Data: Lab Results  Component Value Date   WBC 4.5 02/09/2016   HGB 14.0 02/09/2016   HCT 39.4 (L) 02/09/2016   MCV 85.0 02/09/2016   PLT 250 02/09/2016    Lab Results  Component Value Date   CREATININE 0.76 02/09/2016    No results found for: PSA  No results found for: TESTOSTERONE  No results found for: HGBA1C  Urinalysis    Component Value Date/Time   COLORURINE YELLOW (A) 04/18/2015 1026   APPEARANCEUR CLEAR (A) 04/18/2015 1026   LABSPEC 1.025 04/18/2015 1026   PHURINE 6.0 04/18/2015 1026   GLUCOSEU NEGATIVE 04/18/2015 1026   HGBUR NEGATIVE 04/18/2015 1026   BILIRUBINUR NEGATIVE 04/18/2015 1026   KETONESUR NEGATIVE 04/18/2015 1026   PROTEINUR NEGATIVE 04/18/2015 1026   NITRITE NEGATIVE 04/18/2015 1026   LEUKOCYTESUR TRACE (A) 04/18/2015 1026    Pertinent Imaging: none  Assessment & Plan:    1. 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 Elevated PSA: -RTC 6 months with PSA free and total   No Follow-up on file.  Wilkie AyePatrick Zakary Kimura, MD  [facility] [address], [address] [place], [place] [phone]

## 2016-05-12 ENCOUNTER — Encounter: Payer: Self-pay | Admitting: Urology

## 2016-05-12 ENCOUNTER — Ambulatory Visit: Payer: Commercial Managed Care - PPO | Admitting: Urology

## 2016-06-23 ENCOUNTER — Emergency Department
Admission: EM | Admit: 2016-06-23 | Discharge: 2016-06-23 | Disposition: A | Discharge status: Home / Self Care | Payer: Commercial Managed Care - PPO | Attending: Emergency Medicine | Admitting: Emergency Medicine

## 2016-06-23 ENCOUNTER — Encounter: Payer: Self-pay | Admitting: Emergency Medicine

## 2016-06-23 ENCOUNTER — Emergency Department: Payer: Commercial Managed Care - PPO

## 2016-06-23 DIAGNOSIS — Z87891 Personal history of nicotine dependence: Secondary | ICD-10-CM | POA: Diagnosis not present

## 2016-06-23 DIAGNOSIS — R1033 Periumbilical pain: Secondary | ICD-10-CM

## 2016-06-23 DIAGNOSIS — R109 Unspecified abdominal pain: Secondary | ICD-10-CM | POA: Diagnosis not present

## 2016-06-23 DIAGNOSIS — I10 Essential (primary) hypertension: Secondary | ICD-10-CM | POA: Insufficient documentation

## 2016-06-23 LAB — LIPASE, BLOOD: Lipase: 40 U/L (ref 11–51)

## 2016-06-23 LAB — COMPREHENSIVE METABOLIC PANEL
ALBUMIN: 4 g/dL (ref 3.5–5.0)
ALK PHOS: 84 U/L (ref 38–126)
ALT: 20 U/L (ref 17–63)
AST: 24 U/L (ref 15–41)
Anion gap: 6 (ref 5–15)
BILIRUBIN TOTAL: 0.5 mg/dL (ref 0.3–1.2)
BUN: 15 mg/dL (ref 6–20)
CALCIUM: 8.7 mg/dL — AB (ref 8.9–10.3)
CO2: 25 mmol/L (ref 22–32)
CREATININE: 0.78 mg/dL (ref 0.61–1.24)
Chloride: 108 mmol/L (ref 101–111)
GFR calc Af Amer: >60 mL/min (ref >60)
GFR calc non Af Amer: >60 mL/min (ref >60)
GLUCOSE: 116 mg/dL — AB (ref 65–99)
Potassium: 3.8 mmol/L (ref 3.5–5.1)
Sodium: 139 mmol/L (ref 135–145)
TOTAL PROTEIN: 6.9 g/dL (ref 6.5–8.1)

## 2016-06-23 LAB — URINALYSIS, COMPLETE (UACMP) WITH MICROSCOPIC
BACTERIA UA: NONE SEEN
BILIRUBIN URINE: NEGATIVE
Glucose, UA: NEGATIVE mg/dL
Ketones, ur: NEGATIVE mg/dL
Leukocytes, UA: NEGATIVE
Nitrite: NEGATIVE
PROTEIN: NEGATIVE mg/dL
SPECIFIC GRAVITY, URINE: 1.024 (ref 1.005–1.030)
SQUAMOUS EPITHELIAL / LPF: NONE SEEN
pH: 5 (ref 5.0–8.0)

## 2016-06-23 LAB — CBC
HEMATOCRIT: 39.5 % — AB (ref 40.0–52.0)
HEMOGLOBIN: 13.9 g/dL (ref 13.0–18.0)
MCH: 30.3 pg (ref 26.0–34.0)
MCHC: 35.3 g/dL (ref 32.0–36.0)
MCV: 86 fL (ref 80.0–100.0)
Platelets: 248 10*3/uL (ref 150–440)
RBC: 4.6 MIL/uL (ref 4.40–5.90)
RDW: 13.3 % (ref 11.5–14.5)
WBC: 7 10*3/uL (ref 3.8–10.6)

## 2016-06-23 MED ORDER — IOPAMIDOL (ISOVUE-300) INJECTION 61%: 100.0000 mL | Freq: Once | INTRAVENOUS | Status: DC | PRN

## 2016-06-23 MED ORDER — MORPHINE SULFATE (PF) 4 MG/ML IV SOLN
4.0000 mg | Freq: Once | INTRAVENOUS | Status: AC
  Administered 2016-06-23: 4 mg via INTRAVENOUS
  Filled 2016-06-23: qty 1

## 2016-06-23 MED ORDER — IOPAMIDOL (ISOVUE-300) INJECTION 61%
30.0000 mL | Freq: Once | INTRAVENOUS | Status: AC | PRN
  Administered 2016-06-23: 30 mL via ORAL

## 2016-06-23 MED ORDER — TRAMADOL HCL 50 MG PO TABS: 50.0000 mg | ORAL_TABLET | Freq: Four times a day (QID) | ORAL | 0 refills | Status: DC | PRN

## 2016-06-23 MED ORDER — IOPAMIDOL (ISOVUE-300) INJECTION 61%
100.0000 mL | Freq: Once | INTRAVENOUS | Status: AC | PRN
  Administered 2016-06-23: 100 mL via INTRAVENOUS

## 2016-06-23 MED ORDER — ONDANSETRON HCL 4 MG/2ML IJ SOLN
4.0000 mg | Freq: Once | INTRAMUSCULAR | Status: AC
Start: 2016-06-23 — End: 2016-06-23
  Administered 2016-06-23: 4 mg via INTRAVENOUS
  Filled 2016-06-23: qty 2

## 2016-06-23 NOTE — ED Provider Notes (Signed)
[facility] Emergency Department Provider Note   ____________________________________________    I have reviewed the triage vital signs and the nursing notes.   HISTORY  Chief Complaint Back Pain and Abdominal Pain   Patient interpreter used, Myriam JacobsonHelen  HPI Parker Bishop is a 61 y.o. male who presents with complaints of abdominal pain as well as back pain. He reports the back pain is chronic but last night he developed a cramping pain in his lower/periumbilical abdomen which is moderate to severe. He denies nausea or vomiting. No fevers or chills. Has a history of abdominal surgery secondary to a bowel perforation   Past Medical History:  Diagnosis Date  . Arthritis   . Bowel perforation (HCC)   . Diverticulitis   . Hypertension   . Neuropathy (HCC)   . Tremors of nervous system     Patient Active Problem List   Diagnosis Date Noted  . Prediabetes 04/10/2016  . Elevated PSA, less than 10 ng/ml 04/10/2016  . Pain and swelling of toe of right foot 04/02/2016  . Pain in both feet 04/02/2016  . Essential hypertension 04/02/2016  . Hernia of abdominal cavity   . Abdominal pain 06/05/2015  . Right inguinal hernia 06/05/2015  . Perforated viscus 04/09/2015    Past Surgical History:  Procedure Laterality Date  . BOWEL RESECTION N/A 04/09/2015   Procedure: SMALL BOWEL RESECTION;  Surgeon: Lattie Hawichard E Cooper, MD;  Location: [facility] ORS;  Service: General;  Laterality: N/A;  . COLON SURGERY    . INGUINAL HERNIA REPAIR Right 07/11/2015   Procedure: LAPAROSCOPIC INGUINAL HERNIA;  Surgeon: Lattie Hawichard E Cooper, MD;  Location: [facility] ORS;  Service: General;  Laterality: Right;  . LAPAROTOMY N/A 04/09/2015   Procedure: EXPLORATORY LAPAROTOMY;  Surgeon: Lattie Hawichard E Cooper, MD;  Location: [facility] ORS;  Service: General;  Laterality: N/A;    Prior to Admission medications   Medication Sig Start Date End Date Taking? Authorizing Provider  acetaminophen (TYLENOL) 500 MG  tablet Take 500 mg by mouth every 6 (six) hours as needed.   Yes Historical Provider, MD  tamsulosin (FLOMAX) 0.4 MG CAPS capsule Take 1 capsule (0.4 mg total) by mouth daily after supper. 04/13/16   Malen GauzePatrick L McKenzie, MD  traMADol (ULTRAM) 50 MG tablet Take 1 tablet (50 mg total) by mouth every 6 (six) hours as needed. 06/23/16 06/23/17  Jene Everyobert Shalie Schremp, MD     Allergies Patient has no known allergies.  Family History  Problem Relation Age of Onset  . Arthritis Mother   . Diverticulitis Father   . Kidney cancer Father   . Prostate cancer Neg Hx     Social History Social History  Substance Use Topics  . Smoking status: Former Smoker    Quit date: 04/23/1989  . Smokeless tobacco: Never Used  . Alcohol use No    Review of Systems  Constitutional: No fever/chills   Cardiovascular: Denies chest pain. Respiratory: Denies shortness of breath. Gastrointestinal: As above  Genitourinary: Negative for dysuria. Musculoskeletal: As above Skin: Negative for rash. Neurological: Negative for headaches   10-point ROS otherwise negative.  ____________________________________________   PHYSICAL EXAM:  VITAL SIGNS: ED Triage Vitals  Enc Vitals Group     BP 06/23/16 0930 (!) 153/96     Pulse Rate 06/23/16 0930 79     Resp --      Temp 06/23/16 0930 98.3 F (36.8 C)     Temp Source 06/23/16 0930 Oral     SpO2 06/23/16 0930  98 %     Weight 06/23/16 0931 178 lb (80.7 kg)     Height 06/23/16 0931 5\' 5"  (1.651 m)     Head Circumference --      Peak Flow --      Pain Score 06/23/16 0931 9     Pain Loc --      Pain Edu? --      Excl. in GC? --     Constitutional: Alert and oriented. No acute distress.  Eyes: Conjunctivae are normal.   Nose: No congestion/rhinnorhea. Mouth/Throat: Mucous membranes are moist.   Cardiovascular: Normal rate, regular rhythm. Grossly normal heart sounds.  Good peripheral circulation. Respiratory: Normal respiratory effort.  No retractions. Lungs  CTAB. Gastrointestinal: Mild tenderness periumbilically, mild distention. No significant CVA tenderness Genitourinary: deferred Musculoskeletal: Back: No vertebral tenderness palpation, full range of motion. Warm and well perfused Neurologic:  Normal speech and language. No gross focal neurologic deficits are appreciated.  Skin:  Skin is warm, dry and intact. No rash noted. Psychiatric: Mood and affect are normal. Speech and behavior are normal.  ____________________________________________   LABS (all labs ordered are listed, but only abnormal results are displayed)  Labs Reviewed  CBC - Abnormal; Notable for the following:       Result Value   HCT 39.5 (*)    All other components within normal limits  COMPREHENSIVE METABOLIC PANEL - Abnormal; Notable for the following:    Glucose, Bld 116 (*)    Calcium 8.7 (*)    All other components within normal limits  URINALYSIS, COMPLETE (UACMP) WITH MICROSCOPIC - Abnormal; Notable for the following:    Color, Urine YELLOW (*)    APPearance CLEAR (*)    Hgb urine dipstick SMALL (*)    All other components within normal limits  LIPASE, BLOOD   ____________________________________________  EKG  None ____________________________________________  RADIOLOGY  CT scan unremarkable ____________________________________________   PROCEDURES  Procedure(s) performed: No    Critical Care performed: No ____________________________________________   INITIAL IMPRESSION / ASSESSMENT AND PLAN / ED COURSE  Pertinent labs & imaging results that were available during my care of the patient were reviewed by me and considered in my medical decision making (see chart for details).  Patient with abdominal pain, mild abdominal distention history of abdominal surgery. We'll obtain labs, give IV analgesics and obtain CT abdomen pelvis to further evaluate.    Patient reports pain has clearly resolved. CT scan is unremarkable. Lab work is  benign. He is appropriate for discharge and outpatient follow-up. Return precautions discussed. ____________________________________________   FINAL CLINICAL IMPRESSION(S) / ED DIAGNOSES  Final diagnoses:  Periumbilical abdominal pain      NEW MEDICATIONS STARTED DURING THIS VISIT:  New Prescriptions   TRAMADOL (ULTRAM) 50 MG TABLET    Take 1 tablet (50 mg total) by mouth every 6 (six) hours as needed.     Note:  This document was prepared using Dragon voice recognition software and may include unintentional dictation errors.    Jene Every, MD 06/23/16 [phone]

## 2016-06-23 NOTE — ED Triage Notes (Signed)
Pt to triage with c/o of abd pain that started last night. Pain is low, mid abd. Pt s/p hernia surgery 2 years ago. Pt denies changes in bowel or bladder function or habits but does say that he has urinary frequency.  Also c/o to chronic back pain that goes from head to lumbar with numbness and tingling BUE and BLE. Back pain has been ongoing for 7-8 months.

## 2016-12-21 ENCOUNTER — Emergency Department
Admission: EM | Admit: 2016-12-21 | Discharge: 2016-12-21 | Disposition: A | Discharge status: Home / Self Care | Payer: Commercial Managed Care - PPO | Attending: Student in an Organized Health Care Education/Training Program | Admitting: Student in an Organized Health Care Education/Training Program

## 2016-12-21 ENCOUNTER — Encounter: Payer: Self-pay | Admitting: Emergency Medicine

## 2016-12-21 ENCOUNTER — Emergency Department: Payer: Commercial Managed Care - PPO

## 2016-12-21 DIAGNOSIS — K429 Umbilical hernia without obstruction or gangrene: Secondary | ICD-10-CM | POA: Insufficient documentation

## 2016-12-21 DIAGNOSIS — R1033 Periumbilical pain: Secondary | ICD-10-CM | POA: Diagnosis not present

## 2016-12-21 DIAGNOSIS — Z85528 Personal history of other malignant neoplasm of kidney: Secondary | ICD-10-CM | POA: Diagnosis not present

## 2016-12-21 DIAGNOSIS — Z87891 Personal history of nicotine dependence: Secondary | ICD-10-CM | POA: Insufficient documentation

## 2016-12-21 DIAGNOSIS — I10 Essential (primary) hypertension: Secondary | ICD-10-CM | POA: Insufficient documentation

## 2016-12-21 DIAGNOSIS — Z79899 Other long term (current) drug therapy: Secondary | ICD-10-CM | POA: Diagnosis not present

## 2016-12-21 DIAGNOSIS — R1907 Generalized intra-abdominal and pelvic swelling, mass and lump: Secondary | ICD-10-CM | POA: Diagnosis present

## 2016-12-21 DIAGNOSIS — Z8551 Personal history of malignant neoplasm of bladder: Secondary | ICD-10-CM | POA: Insufficient documentation

## 2016-12-21 DIAGNOSIS — R109 Unspecified abdominal pain: Secondary | ICD-10-CM | POA: Diagnosis not present

## 2016-12-21 LAB — COMPREHENSIVE METABOLIC PANEL
ALBUMIN: 4.3 g/dL (ref 3.5–5.0)
ALK PHOS: 82 U/L (ref 38–126)
ALT: 17 U/L (ref 17–63)
ANION GAP: 5 (ref 5–15)
AST: 21 U/L (ref 15–41)
BILIRUBIN TOTAL: 0.7 mg/dL (ref 0.3–1.2)
BUN: 20 mg/dL (ref 6–20)
CALCIUM: 9.1 mg/dL (ref 8.9–10.3)
CO2: 24 mmol/L (ref 22–32)
CREATININE: 0.8 mg/dL (ref 0.61–1.24)
Chloride: 110 mmol/L (ref 101–111)
GFR calc Af Amer: >60 mL/min (ref >60)
GFR calc non Af Amer: >60 mL/min (ref >60)
GLUCOSE: 118 mg/dL — AB (ref 65–99)
Potassium: 3.8 mmol/L (ref 3.5–5.1)
Sodium: 139 mmol/L (ref 135–145)
TOTAL PROTEIN: 7.3 g/dL (ref 6.5–8.1)

## 2016-12-21 LAB — URINALYSIS, COMPLETE (UACMP) WITH MICROSCOPIC
Bacteria, UA: NONE SEEN
Bilirubin Urine: NEGATIVE
Glucose, UA: NEGATIVE mg/dL
Ketones, ur: NEGATIVE mg/dL
Leukocytes, UA: NEGATIVE
NITRITE: NEGATIVE
PH: 5 (ref 5.0–8.0)
Protein, ur: NEGATIVE mg/dL
SPECIFIC GRAVITY, URINE: 1.029 (ref 1.005–1.030)
Squamous Epithelial / LPF: NONE SEEN

## 2016-12-21 LAB — CBC
HCT: 39.9 % — ABNORMAL LOW (ref 40.0–52.0)
HEMOGLOBIN: 14 g/dL (ref 13.0–18.0)
MCH: 30.1 pg (ref 26.0–34.0)
MCHC: 35.2 g/dL (ref 32.0–36.0)
MCV: 85.5 fL (ref 80.0–100.0)
PLATELETS: 256 10*3/uL (ref 150–440)
RBC: 4.67 MIL/uL (ref 4.40–5.90)
RDW: 13.5 % (ref 11.5–14.5)
WBC: 5.5 10*3/uL (ref 3.8–10.6)

## 2016-12-21 LAB — LIPASE, BLOOD: Lipase: 34 U/L (ref 11–51)

## 2016-12-21 MED ORDER — DICYCLOMINE HCL 10 MG PO CAPS
10.0000 mg | ORAL_CAPSULE | Freq: Three times a day (TID) | ORAL | 0 refills | Status: DC | PRN
Start: 2016-12-21 — End: 2017-02-23

## 2016-12-21 MED ORDER — POLYETHYLENE GLYCOL 3350 17 G PO PACK: 17.0000 g | PACK | Freq: Every day | ORAL | 0 refills | Status: DC

## 2016-12-21 NOTE — ED Provider Notes (Signed)
[facility] Emergency Department Provider Note    First MD Initiated Contact with Patient 12/21/16 1005     (approximate)  I have reviewed the triage vital signs and the nursing notes.   HISTORY  Chief Complaint Abdominal mass    HPI Parker Bishop is a 61 y.o. male with a history of periumbilical hernia presents with 1 month of intermittent mass and pain in the left periumbilical region that is worse with standing lifting heavy things or straining. States intermittently after he eats he will have some pain. States he does not have any nausea or vomiting. He is still moving his bowels. Denies any fevers. No chest pain or shortness of breath. States the pain will intermittently radiate through to his back.Patient had a CT imaging performed for similar discomfort in January of this year which showed no evidence of aneurysm. Did have nonobstructing left periumbilical hernia with possible adhesions.   Past Medical History:  Diagnosis Date  . Arthritis   . Bowel perforation (HCC)   . Diverticulitis   . Hypertension   . Neuropathy   . Tremors of nervous system    Family History  Problem Relation Age of Onset  . Arthritis Mother   . Diverticulitis Father   . Kidney cancer Father   . Prostate cancer Neg Hx    Past Surgical History:  Procedure Laterality Date  . BOWEL RESECTION N/A 04/09/2015   Procedure: SMALL BOWEL RESECTION;  Surgeon: Lattie Hawichard E Cooper, MD;  Location: [facility] ORS;  Service: General;  Laterality: N/A;  . COLON SURGERY    . INGUINAL HERNIA REPAIR Right 07/11/2015   Procedure: LAPAROSCOPIC INGUINAL HERNIA;  Surgeon: Lattie Hawichard E Cooper, MD;  Location: [facility] ORS;  Service: General;  Laterality: Right;  . LAPAROTOMY N/A 04/09/2015   Procedure: EXPLORATORY LAPAROTOMY;  Surgeon: Lattie Hawichard E Cooper, MD;  Location: [facility] ORS;  Service: General;  Laterality: N/A;   Patient Active Problem List   Diagnosis Date Noted  . Prediabetes 04/10/2016  .  Elevated PSA, less than 10 ng/ml 04/10/2016  . Pain and swelling of toe of right foot 04/02/2016  . Pain in both feet 04/02/2016  . Essential hypertension 04/02/2016  . Hernia of abdominal cavity   . Abdominal pain 06/05/2015  . Right inguinal hernia 06/05/2015  . Perforated viscus 04/09/2015      Prior to Admission medications   Medication Sig Start Date End Date Taking? Authorizing Provider  acetaminophen (TYLENOL) 500 MG tablet Take 500 mg by mouth every 6 (six) hours as needed.   Yes [provider]  dicyclomine (BENTYL) 10 MG capsule Take 1 capsule (10 mg total) by mouth 3 (three) times daily as needed for spasms. 12/21/16 01/04/17  Willy Eddyobinson, Ethaniel Garfield, MD  polyethylene glycol [facility](MIRALAX / Ethelene HalGLYCOLAX) packet Take 17 g by mouth daily. Mix one tablespoon with 8oz of your favorite juice or water every day until you are having soft formed stools. Then start taking once daily if you didn't have a stool the day before. 12/21/16   Willy Eddyobinson, Zurii Hewes, MD  tamsulosin (FLOMAX) 0.4 MG CAPS capsule Take 1 capsule (0.4 mg total) by mouth daily after supper. Patient not taking: Reported on 12/21/2016 04/13/16   Malen GauzeMcKenzie, Nanea Jared L, MD  traMADol (ULTRAM) 50 MG tablet Take 1 tablet (50 mg total) by mouth every 6 (six) hours as needed. Patient not taking: Reported on 12/21/2016 06/23/16 06/23/17  Jene EveryKinner, Robert, MD    Allergies Patient has no known allergies.    Social History Social  History  Substance Use Topics  . Smoking status: Former Smoker    Quit date: 04/23/1989  . Smokeless tobacco: Never Used  . Alcohol use No    Review of Systems Patient denies headaches, rhinorrhea, blurry vision, numbness, shortness of breath, chest pain, edema, cough, abdominal pain, nausea, vomiting, diarrhea, dysuria, fevers, rashes or hallucinations unless otherwise stated above in HPI. ____________________________________________   PHYSICAL EXAM:  VITAL SIGNS: Vitals:   12/21/16 0837 12/21/16 1128  BP:  (!) 147/88 (!) 158/99  Pulse: 84 64  Resp: 18 16  Temp: 98.4 F (36.9 C) 97.8 F (36.6 C)    Constitutional: Alert and oriented. Well appearing and in no acute distress. Eyes: Conjunctivae are normal.  Head: Atraumatic. Nose: No congestion/rhinnorhea. Mouth/Throat: Mucous membranes are moist.   Neck: No stridor. Painless ROM.  Cardiovascular: Normal rate, regular rhythm. Grossly normal heart sounds.  Good peripheral circulation. Respiratory: Normal respiratory effort.  No retractions. Lungs CTAB. Gastrointestinal: Soft and nontender.  There is a small, soft,  minimally tender left periumbilical hernia.  No overlying cellulitis or erythema. No distention. No abdominal bruits. No CVA tenderness. Musculoskeletal: No lower extremity tenderness nor edema.  No joint effusions. Neurologic:  Normal speech and language. No gross focal neurologic deficits are appreciated. No facial droop Skin:  Skin is warm, dry and intact. No rash noted. Psychiatric: Mood and affect are normal. Speech and behavior are normal.  ____________________________________________   LABS (all labs ordered are listed, but only abnormal results are displayed)  Results for orders placed or performed during the hospital encounter of 12/21/16 (from the past 24 hour(s))  Lipase, blood     Status: None   Collection Time: 12/21/16  8:38 AM  Result Value Ref Range   Lipase 34 11 - 51 U/L  Comprehensive metabolic panel     Status: Abnormal   Collection Time: 12/21/16  8:38 AM  Result Value Ref Range   Sodium 139 135 - 145 mmol/L   Potassium 3.8 3.5 - 5.1 mmol/L   Chloride 110 101 - 111 mmol/L   CO2 24 22 - 32 mmol/L   Glucose, Bld 118 (H) 65 - 99 mg/dL   BUN 20 6 - 20 mg/dL   Creatinine, Ser 8.110.80 0.61 - 1.24 mg/dL   Calcium 9.1 8.9 - 91.410.3 mg/dL   Total Protein 7.3 6.5 - 8.1 g/dL   Albumin 4.3 3.5 - 5.0 g/dL   AST 21 15 - 41 U/L   ALT 17 17 - 63 U/L   Alkaline Phosphatase 82 38 - 126 U/L   Total Bilirubin 0.7 0.3 -  1.2 mg/dL   GFR calc non Af Amer >60 >60 mL/min   GFR calc Af Amer >60 >60 mL/min   Anion gap 5 5 - 15  CBC     Status: Abnormal   Collection Time: 12/21/16  8:38 AM  Result Value Ref Range   WBC 5.5 3.8 - 10.6 K/uL   RBC 4.67 4.40 - 5.90 MIL/uL   Hemoglobin 14.0 13.0 - 18.0 g/dL   HCT 78.239.9 (L) 95.640.0 - 21.352.0 %   MCV 85.5 80.0 - 100.0 fL   MCH 30.1 26.0 - 34.0 pg   MCHC 35.2 32.0 - 36.0 g/dL   RDW 08.613.5 57.811.5 - 46.914.5 %   Platelets 256 150 - 440 K/uL  Urinalysis, Complete w Microscopic     Status: Abnormal   Collection Time: 12/21/16  8:39 AM  Result Value Ref Range   Color, Urine AMBER (A) YELLOW  APPearance HAZY (A) CLEAR   Specific Gravity, Urine 1.029 1.005 - 1.030   pH 5.0 5.0 - 8.0   Glucose, UA NEGATIVE NEGATIVE mg/dL   Hgb urine dipstick SMALL (A) NEGATIVE   Bilirubin Urine NEGATIVE NEGATIVE   Ketones, ur NEGATIVE NEGATIVE mg/dL   Protein, ur NEGATIVE NEGATIVE mg/dL   Nitrite NEGATIVE NEGATIVE   Leukocytes, UA NEGATIVE NEGATIVE   RBC / HPF 0-5 0 - 5 RBC/hpf   WBC, UA 0-5 0 - 5 WBC/hpf   Bacteria, UA NONE SEEN NONE SEEN   Squamous Epithelial / LPF NONE SEEN NONE SEEN   Mucous PRESENT    ____________________________________________   ____________________________________________  RADIOLOGY  I personally reviewed all radiographic images ordered to evaluate for the above acute complaints and reviewed radiology reports and findings.  These findings were personally discussed with the patient.  Please see medical record for radiology report.  ____________________________________________   PROCEDURES  Procedure(s) performed:  Procedures    Critical Care performed: no ____________________________________________   INITIAL IMPRESSION / ASSESSMENT AND PLAN / ED COURSE  Pertinent labs & imaging results that were available during my care of the patient were reviewed by me and considered in my medical decision making (see chart for details).  DDX: hernia,  strangulated hernia, incarcerated hernia, pancreatitis, msk strain, constipation, obstruction  Ivory Bail is a 61 y.o. who presents to the ED with Concern over of periumbilical abdominal mass. Blood work is reassuring. Clinically this appears consistent with a known periumbilical hernia. There is no evidence of overlying cellulitis or erythema. The hernia is soft. Based on exam not certain I can completely reduce it but does not clinically feel consistent with an incarcerated or straining related hernia. We'll order x-ray to evaluate for any evidence of obstructive process. He had a CT imaging earlier this year which showed that he did not have any evidence of AAA. This only symptoms ongoing for over a month or do feel patient will likely be appropriate for outpatient workup.  Clinical Course as of Dec 21 1609  Mon Dec 21, 2016  1044 X-ray of the abdomen shows diffuse stool burden without obstructive process. His abdominal exam is soft and benign. Do not feel this is clinically consistent with emergent intra-abdominal process.  The patient will be discharged with pain relievers as well as laxatives and referral to general surgery for reevaluation of this periumbilical hernia.  [PR]    Clinical Course User Index [PR] [name], MD     ____________________________________________   FINAL CLINICAL IMPRESSION(S) / ED DIAGNOSES  Final diagnoses:  Periumbilical hernia  Periumbilical abdominal pain      NEW MEDICATIONS STARTED DURING THIS VISIT:  Discharge Medication List as of 12/21/2016 10:46 AM    START taking these medications   Details  dicyclomine (BENTYL) 10 MG capsule Take 1 capsule (10 mg total) by mouth 3 (three) times daily as needed for spasms., Starting Mon 12/21/2016, Until Mon 01/04/2017, Print    polyethylene glycol (MIRALAX / GLYCOLAX) packet Take 17 g by mouth daily. Mix one tablespoon with 8oz of your favorite juice or water every day until you are having soft  formed stools. Then start taking once daily if you didn't have a stool the day before., Starting Mon 12/21/2016, Print         Note:  This document was prepared using Dragon voice recognition software and may include unintentional dictation errors.    [name], MD 12/21/16 [phone]

## 2016-12-21 NOTE — ED Notes (Signed)
Interpreter, MD, and this RN to bedside at this time.

## 2016-12-21 NOTE — ED Triage Notes (Signed)
Pt reports history of small bowel surgery one year ago. Pt reports for one month he has felt an abdominal mass to the left of the incision site. Pt reports pain upon eating and having a bowel movement in the area. Denies NVD. No apparent distress in triage. Ambulatory to triage.

## 2016-12-21 NOTE — ED Notes (Signed)
NAD noted at time of D/C. Pt denies questions or concerns. Pt ambulatory to the lobby at this time. Parker CooperLoyda, Interpreter at bedside for assessment and review of D/C instructions with this RN and MD.

## 2016-12-30 ENCOUNTER — Telehealth: Payer: Self-pay

## 2016-12-30 NOTE — Telephone Encounter (Signed)
Called patient and was not able to leave him a message. I will call him back to schedule him an appointment with Dr. Excell Seltzerooper for his Umbilical Hernia. Patient was seen at the ED.

## 2016-12-31 NOTE — Telephone Encounter (Signed)
Called patient again to schedule an appointment for a consult and was not able to leave him a voicemail.

## 2017-01-20 DIAGNOSIS — K429 Umbilical hernia without obstruction or gangrene: Secondary | ICD-10-CM | POA: Diagnosis not present

## 2017-01-20 DIAGNOSIS — R079 Chest pain, unspecified: Secondary | ICD-10-CM | POA: Diagnosis not present

## 2017-01-20 DIAGNOSIS — R030 Elevated blood-pressure reading, without diagnosis of hypertension: Secondary | ICD-10-CM | POA: Diagnosis not present

## 2017-01-20 DIAGNOSIS — R4702 Dysphasia: Secondary | ICD-10-CM | POA: Diagnosis not present

## 2017-01-21 ENCOUNTER — Other Ambulatory Visit
Admission: RE | Admit: 2017-01-21 | Discharge: 2017-01-21 | Disposition: A | Discharge status: Home / Self Care | Payer: Commercial Managed Care - PPO | Source: Ambulatory Visit | Attending: Family Medicine | Admitting: Family Medicine

## 2017-01-21 DIAGNOSIS — K5900 Constipation, unspecified: Secondary | ICD-10-CM | POA: Diagnosis present

## 2017-01-21 DIAGNOSIS — R4702 Dysphasia: Secondary | ICD-10-CM | POA: Diagnosis not present

## 2017-01-21 DIAGNOSIS — R030 Elevated blood-pressure reading, without diagnosis of hypertension: Secondary | ICD-10-CM | POA: Insufficient documentation

## 2017-01-21 DIAGNOSIS — R079 Chest pain, unspecified: Secondary | ICD-10-CM | POA: Diagnosis not present

## 2017-01-21 DIAGNOSIS — K219 Gastro-esophageal reflux disease without esophagitis: Secondary | ICD-10-CM | POA: Insufficient documentation

## 2017-01-21 DIAGNOSIS — K429 Umbilical hernia without obstruction or gangrene: Secondary | ICD-10-CM | POA: Insufficient documentation

## 2017-01-21 LAB — TROPONIN I: Troponin I: <0.03 ng/mL (ref <0.03)

## 2017-02-02 DIAGNOSIS — K429 Umbilical hernia without obstruction or gangrene: Secondary | ICD-10-CM | POA: Diagnosis not present

## 2017-02-02 DIAGNOSIS — R079 Chest pain, unspecified: Secondary | ICD-10-CM | POA: Diagnosis not present

## 2017-02-02 DIAGNOSIS — R030 Elevated blood-pressure reading, without diagnosis of hypertension: Secondary | ICD-10-CM | POA: Diagnosis not present

## 2017-02-04 DIAGNOSIS — K439 Ventral hernia without obstruction or gangrene: Secondary | ICD-10-CM | POA: Diagnosis not present

## 2017-02-18 ENCOUNTER — Inpatient Hospital Stay: Admission: RE | Admit: 2017-02-18 | Payer: Commercial Managed Care - PPO | Source: Ambulatory Visit

## 2017-02-23 ENCOUNTER — Emergency Department
Admission: EM | Admit: 2017-02-23 | Discharge: 2017-02-23 | Disposition: A | Discharge status: Home / Self Care | Payer: Commercial Managed Care - PPO

## 2017-02-23 ENCOUNTER — Encounter
Admission: RE | Admit: 2017-02-23 | Discharge: 2017-02-23 | Disposition: A | Discharge status: Home / Self Care | Payer: Commercial Managed Care - PPO | Source: Ambulatory Visit | Attending: Surgery | Admitting: Surgery

## 2017-02-23 DIAGNOSIS — K219 Gastro-esophageal reflux disease without esophagitis: Secondary | ICD-10-CM | POA: Diagnosis not present

## 2017-02-23 DIAGNOSIS — M1990 Unspecified osteoarthritis, unspecified site: Secondary | ICD-10-CM | POA: Diagnosis not present

## 2017-02-23 DIAGNOSIS — Z87891 Personal history of nicotine dependence: Secondary | ICD-10-CM | POA: Diagnosis not present

## 2017-02-23 DIAGNOSIS — I10 Essential (primary) hypertension: Secondary | ICD-10-CM | POA: Diagnosis not present

## 2017-02-23 DIAGNOSIS — Z79899 Other long term (current) drug therapy: Secondary | ICD-10-CM | POA: Diagnosis not present

## 2017-02-23 DIAGNOSIS — K439 Ventral hernia without obstruction or gangrene: Secondary | ICD-10-CM | POA: Diagnosis present

## 2017-02-23 HISTORY — DX: Myoneural disorder, unspecified: G70.9

## 2017-02-23 NOTE — Patient Instructions (Addendum)
Your procedure is scheduled ZO:XWRUE 02/25/17 Su procedimiento est programado para: Report to Day Surgery. Presntese a: To find out your arrival time please call [phone] between 1PM - 3PM on WEd. 02/24/17. Para saber su hora de llegada por favor llame al [phone] entre la 1PM - 3PM el da:  Remember: Instructions that are not followed completely may result in serious medical risk, up to and including death, or upon the discretion of your surgeon and anesthesiologist your surgery may need to be rescheduled.  Recuerde: Las instrucciones que no se siguen completamente Armed forces logistics/support/administrative officer en un riesgo de salud grave, incluyendo hasta la [place] o a discrecin de su cirujano y Scientific laboratory technician, su ciruga se puede posponer.   __X_ 1.Do not eat food after midnight the night before your procedure. No gum chewing or hard candies. You may drink clear liquids up to 2 hours   before you are scheduled to arrive for your surgery- DO not drink clear liquids within 2 hours of the start of your surgery.     Clear Liquids include:    water, apple juice without pulp, clear carbohydrate drink such as Clearfast of Gartorade, Black Coffee or Tea (Do not add anything to coffee or tea).      No coma nada despus de la medianoche de la noche anterior a su procedimiento. No coma chicles ni caramelos duros. Puede tomar   lquidos claros hasta 2 horas antes de su hora programada de llegada al hospital para su procedimiento. No tome lquidos claros durante el transcurso de las 2 horas de su llegada programada al hospital para su procedimiento, ya que esto puede llevar a que su procedimiento se retrase o tenga que volver a Magazine features editor.  Los lquidos claros incluyen:         - Agua o jugo de [place] sin pulpa         - Bebidas claras con carbohidratos como ClearFast o Gatorade         - Caf negro o t claro (sin leche, sin cremas, no agregue nada al caf ni al  t)  No tome nada que no est en esta lista.  Los  pacientes con diabetes tipo 1 y tipo 2 solo deben Printmaker.  Llame a la clnica de PreCare o a la unidad de Same Day Surgery si  tiene alguna pregunta sobre estas instrucciones.              _X__ 2.Do Not Smoke or use e-cigarettes For 24 Hours Prior to Your  Surgery.  Do not use any chewable tobacco products for at least 6 hours prior to surgery.    No fume ni use cigarrillos electrnicos durante las 24 horas previas a su [place].  No use ningn producto de tabaco masticable durante  al menos 6 horas antes de la [place].     __X_ 3. No alcohol for 24 hours before or after surgery.    No tome alcohol durante las 24 horas antes ni despus de la [place].   ____4. Bring all medications with you on the day of surgery if instructed.    Lleve todos los medicamentos con usted el da de su ciruga si se le ha indicado as.   ____ 5. Notify your doctor if there is any change in your medical condition (cold, fever, infections).    Informe a su mdico si hay algn cambio en su condicin mdica (resfriado, fiebre, infecciones).   Do not wear jewelry, make-up, hairpins, clips or nail polish.  No use joyas, maquillajes, pinzas/ganchos para el cabello ni esmalte de uas.  Do not wear lotions, powders, or perfumes. You may wear deodorant.  No use lociones, polvos o perfumes.  Puede usar desodorante.    Do not shave 48 hours prior to surgery. Men may shave face and neck.  No se afeite 48 horas antes de la [place].  Los hombres pueden [employer] cara  y el cuello.   Do not bring valuables to the hospital.   No lleve objetos de valor al hospital.  [facility] is not responsible for any belongings or valuables.  Verdi no se hace responsable de ningn tipo de pertenencias uobjetos de Licensed conveyancer.               Contacts, dentures or bridgework may not be worn into surgery.  Los lentes de [place], las dentaduras postizas o puentes no se pueden usar en la [place].  Leave your suitcase in the car. After  surgery it may be brought to your room.  Deje su maleta en el auto.  Despus de la ciruga podr traerla a su habitacin.  For patients admitted to the hospital, discharge time is determined by your treatment team.  Para los pacientes que sean ingresados al hospital, el tiempo en el cual se le dar de alta es determinado por su equipo de [place].   Patients discharged the day of surgery will not be allowed to drive home. A los pacientes que se les da de alta el mismo da de la ciruga no se les permitir conducir a Higher education careers adviser.   Please read over the following fact sheets that you were given: Por favor lea las siguientes hojas de informacin que le dieron:      __x__ Take these medicines the morning of surgery with A SIP OF WATER:          [employer] medicinas la maana de la ciruga con UN SORBO DE AGUA:  1. None  2.   3.   4.       5.  6.  ____ Fleet Enema (as directed)          Enema de Fleet (segn lo indicado)    __x__ Use CHG Soap as directed          Utilice el jabn de CHG segn lo indicado  ____ Use inhalers on the day of surgery          Use los inhaladores el da de la ciruga  ____ Stop metformin 2 days prior to surgery          Deje de tomar el metformin 2 das antes de la ciruga    ____ Take 1/2 of usual insulin dose the night before surgery and none on the morning of surgery           Tome la mitad de la dosis habitual de insulina la noche antes de la [place] y no tome nada en la maana de la             ciruga  ____ Stop Coumadin/Plavix/aspirin on           Deje de tomar el Coumadin/Plavix/aspirina el da:  ____ Stop Anti-inflammatories on           Deje de tomar antiinflamatorios el da:   ____ Stop supplements until after surgery            Deje de tomar suplementos hasta despus de la ciruga  ____ Bring C-Pap to  the hospital          [facility] el C-Pap al hospital

## 2017-02-24 MED ORDER — CEFAZOLIN SODIUM-DEXTROSE 2-4 GM/100ML-% IV SOLN
2.0000 g | Freq: Once | INTRAVENOUS | Status: AC
  Administered 2017-02-25: 2 g via INTRAVENOUS

## 2017-02-25 ENCOUNTER — Ambulatory Visit: Payer: Commercial Managed Care - PPO | Admitting: Anesthesiology

## 2017-02-25 ENCOUNTER — Encounter
Admission: RE | Disposition: A | Discharge status: Home / Self Care | Payer: Self-pay | Source: Ambulatory Visit | Attending: Surgery

## 2017-02-25 ENCOUNTER — Ambulatory Visit
Admission: RE | Admit: 2017-02-25 | Discharge: 2017-02-25 | Disposition: A | Discharge status: Home / Self Care | Payer: Commercial Managed Care - PPO | Source: Ambulatory Visit | Attending: Surgery | Admitting: Surgery

## 2017-02-25 DIAGNOSIS — M1990 Unspecified osteoarthritis, unspecified site: Secondary | ICD-10-CM | POA: Insufficient documentation

## 2017-02-25 DIAGNOSIS — K219 Gastro-esophageal reflux disease without esophagitis: Secondary | ICD-10-CM | POA: Insufficient documentation

## 2017-02-25 DIAGNOSIS — I10 Essential (primary) hypertension: Secondary | ICD-10-CM | POA: Diagnosis not present

## 2017-02-25 DIAGNOSIS — K439 Ventral hernia without obstruction or gangrene: Secondary | ICD-10-CM | POA: Insufficient documentation

## 2017-02-25 DIAGNOSIS — Z79899 Other long term (current) drug therapy: Secondary | ICD-10-CM | POA: Insufficient documentation

## 2017-02-25 DIAGNOSIS — Z87891 Personal history of nicotine dependence: Secondary | ICD-10-CM | POA: Insufficient documentation

## 2017-02-25 HISTORY — PX: VENTRAL HERNIA REPAIR: SHX424

## 2017-02-25 SURGERY — REPAIR, HERNIA, VENTRAL
Anesthesia: General | Site: Abdomen | Wound class: Clean

## 2017-02-25 MED ORDER — LACTATED RINGERS IV SOLN
INTRAVENOUS | Status: DC
  Administered 2017-02-25: 07:00:00 via INTRAVENOUS

## 2017-02-25 MED ORDER — BUPIVACAINE-EPINEPHRINE 0.5% -1:200000 IJ SOLN
INTRAMUSCULAR | Status: DC | PRN
  Administered 2017-02-25: 10 mL

## 2017-02-25 MED ORDER — LIDOCAINE HCL 2 % EX GEL
CUTANEOUS | Status: DC | PRN
  Administered 2017-02-25: 1 via TOPICAL

## 2017-02-25 MED ORDER — ONDANSETRON HCL 4 MG/2ML IJ SOLN: 4.0000 mg | Freq: Once | INTRAMUSCULAR | Status: DC | PRN

## 2017-02-25 MED ORDER — EPHEDRINE SULFATE 50 MG/ML IJ SOLN
INTRAMUSCULAR | Status: AC
  Filled 2017-02-25: qty 1

## 2017-02-25 MED ORDER — LIDOCAINE HCL (CARDIAC) 20 MG/ML IV SOLN
INTRAVENOUS | Status: DC | PRN
  Administered 2017-02-25: 100 mg via INTRAVENOUS

## 2017-02-25 MED ORDER — PHENYLEPHRINE HCL 10 MG/ML IJ SOLN
INTRAMUSCULAR | Status: DC | PRN
  Administered 2017-02-25: 100 ug via INTRAVENOUS
  Administered 2017-02-25: 200 ug via INTRAVENOUS
  Administered 2017-02-25 (×2): 100 ug via INTRAVENOUS

## 2017-02-25 MED ORDER — DEXAMETHASONE SODIUM PHOSPHATE 10 MG/ML IJ SOLN
INTRAMUSCULAR | Status: AC
Start: 2017-02-25 — End: ?
  Filled 2017-02-25: qty 1

## 2017-02-25 MED ORDER — ACETAMINOPHEN 10 MG/ML IV SOLN
INTRAVENOUS | Status: AC
  Filled 2017-02-25: qty 100

## 2017-02-25 MED ORDER — PROPOFOL 10 MG/ML IV BOLUS
INTRAVENOUS | Status: DC | PRN
  Administered 2017-02-25: 150 mg via INTRAVENOUS

## 2017-02-25 MED ORDER — GLYCOPYRROLATE 0.2 MG/ML IJ SOLN
INTRAMUSCULAR | Status: AC
  Filled 2017-02-25: qty 1

## 2017-02-25 MED ORDER — HYDROCODONE-ACETAMINOPHEN 5-325 MG PO TABS
ORAL_TABLET | ORAL | Status: AC
  Filled 2017-02-25: qty 1

## 2017-02-25 MED ORDER — FENTANYL CITRATE (PF) 100 MCG/2ML IJ SOLN
INTRAMUSCULAR | Status: AC
  Filled 2017-02-25: qty 2

## 2017-02-25 MED ORDER — SUGAMMADEX SODIUM 200 MG/2ML IV SOLN
INTRAVENOUS | Status: AC
  Filled 2017-02-25: qty 2

## 2017-02-25 MED ORDER — LIDOCAINE HCL 2 % EX GEL
CUTANEOUS | Status: AC
  Filled 2017-02-25: qty 5

## 2017-02-25 MED ORDER — ONDANSETRON HCL 4 MG/2ML IJ SOLN
INTRAMUSCULAR | Status: DC | PRN
  Administered 2017-02-25: 4 mg via INTRAVENOUS

## 2017-02-25 MED ORDER — FAMOTIDINE 20 MG PO TABS
ORAL_TABLET | ORAL | Status: AC
  Filled 2017-02-25: qty 1

## 2017-02-25 MED ORDER — SUGAMMADEX SODIUM 200 MG/2ML IV SOLN
INTRAVENOUS | Status: DC | PRN
  Administered 2017-02-25: 162.4 mg via INTRAVENOUS

## 2017-02-25 MED ORDER — DEXAMETHASONE SODIUM PHOSPHATE 10 MG/ML IJ SOLN
INTRAMUSCULAR | Status: DC | PRN
  Administered 2017-02-25: 10 mg via INTRAVENOUS

## 2017-02-25 MED ORDER — LIDOCAINE HCL (PF) 2 % IJ SOLN
INTRAMUSCULAR | Status: AC
Start: 2017-02-25 — End: ?
  Filled 2017-02-25: qty 6

## 2017-02-25 MED ORDER — HYDROCODONE-ACETAMINOPHEN 5-325 MG PO TABS
1.0000 | ORAL_TABLET | ORAL | Status: DC | PRN
  Administered 2017-02-25: 1 via ORAL

## 2017-02-25 MED ORDER — ROCURONIUM BROMIDE 100 MG/10ML IV SOLN
INTRAVENOUS | Status: DC | PRN
  Administered 2017-02-25: 20 mg via INTRAVENOUS
  Administered 2017-02-25: 30 mg via INTRAVENOUS

## 2017-02-25 MED ORDER — PHENYLEPHRINE HCL 10 MG/ML IJ SOLN
INTRAMUSCULAR | Status: AC
Start: 2017-02-25 — End: ?
  Filled 2017-02-25: qty 1

## 2017-02-25 MED ORDER — ONDANSETRON HCL 4 MG/2ML IJ SOLN
INTRAMUSCULAR | Status: AC
  Filled 2017-02-25: qty 2

## 2017-02-25 MED ORDER — FENTANYL CITRATE (PF) 100 MCG/2ML IJ SOLN
25.0000 ug | INTRAMUSCULAR | Status: DC | PRN
  Administered 2017-02-25 (×2): 25 ug via INTRAVENOUS

## 2017-02-25 MED ORDER — ACETAMINOPHEN 10 MG/ML IV SOLN
INTRAVENOUS | Status: DC | PRN
  Administered 2017-02-25: 1000 mg via INTRAVENOUS

## 2017-02-25 MED ORDER — MIDAZOLAM HCL 2 MG/2ML IJ SOLN
INTRAMUSCULAR | Status: AC
  Filled 2017-02-25: qty 2

## 2017-02-25 MED ORDER — ROCURONIUM BROMIDE 50 MG/5ML IV SOLN
INTRAVENOUS | Status: AC
  Filled 2017-02-25: qty 1

## 2017-02-25 MED ORDER — CEFAZOLIN SODIUM-DEXTROSE 2-4 GM/100ML-% IV SOLN
INTRAVENOUS | Status: AC
  Filled 2017-02-25: qty 100

## 2017-02-25 MED ORDER — FAMOTIDINE 20 MG PO TABS
20.0000 mg | ORAL_TABLET | Freq: Once | ORAL | Status: AC
  Administered 2017-02-25: 20 mg via ORAL

## 2017-02-25 MED ORDER — EPHEDRINE SULFATE 50 MG/ML IJ SOLN
INTRAMUSCULAR | Status: DC | PRN
Start: 2017-02-25 — End: 2017-02-25
  Administered 2017-02-25: 10 mg via INTRAVENOUS

## 2017-02-25 MED ORDER — MIDAZOLAM HCL 2 MG/2ML IJ SOLN
INTRAMUSCULAR | Status: DC | PRN
  Administered 2017-02-25: 2 mg via INTRAVENOUS

## 2017-02-25 MED ORDER — FENTANYL CITRATE (PF) 100 MCG/2ML IJ SOLN
INTRAMUSCULAR | Status: DC | PRN
  Administered 2017-02-25 (×2): 50 ug via INTRAVENOUS
  Administered 2017-02-25: 25 ug via INTRAVENOUS

## 2017-02-25 MED ORDER — BUPIVACAINE-EPINEPHRINE (PF) 0.5% -1:200000 IJ SOLN
INTRAMUSCULAR | Status: AC
  Filled 2017-02-25: qty 30

## 2017-02-25 MED ORDER — PROPOFOL 10 MG/ML IV BOLUS
INTRAVENOUS | Status: AC
  Filled 2017-02-25: qty 40

## 2017-02-25 MED ORDER — SUCCINYLCHOLINE CHLORIDE 20 MG/ML IJ SOLN
INTRAMUSCULAR | Status: AC
  Filled 2017-02-25: qty 1

## 2017-02-25 MED ORDER — HYDROCODONE-ACETAMINOPHEN 5-325 MG PO TABS: 1.0000 | ORAL_TABLET | Freq: Four times a day (QID) | ORAL | 0 refills | Status: DC | PRN

## 2017-02-25 SURGICAL SUPPLY — 28 items
BARD SOFT MESH ×3 IMPLANT
CANISTER SUCT 1200ML W/VALVE (MISCELLANEOUS) ×3 IMPLANT
CHLORAPREP W/TINT 26ML (MISCELLANEOUS) ×3 IMPLANT
DERMABOND ADVANCED (GAUZE/BANDAGES/DRESSINGS) ×2
DERMABOND ADVANCED .7 DNX12 (GAUZE/BANDAGES/DRESSINGS) ×1 IMPLANT
DRAPE LAPAROTOMY 100X77 ABD (DRAPES) ×3 IMPLANT
ELECT REM PT RETURN 9FT ADLT (ELECTROSURGICAL) ×3
ELECTRODE REM PT RTRN 9FT ADLT (ELECTROSURGICAL) ×1 IMPLANT
GAUZE SPONGE 4X4 12PLY STRL (GAUZE/BANDAGES/DRESSINGS) IMPLANT
GLOVE BIO SURGEON STRL SZ 6 (GLOVE) ×6 IMPLANT
GLOVE BIO SURGEON STRL SZ7 (GLOVE) ×6 IMPLANT
GLOVE BIO SURGEON STRL SZ7.5 (GLOVE) ×3 IMPLANT
GOWN STRL REUS W/ TWL LRG LVL3 (GOWN DISPOSABLE) ×3 IMPLANT
GOWN STRL REUS W/TWL LRG LVL3 (GOWN DISPOSABLE) ×6
KIT RM TURNOVER STRD PROC AR (KITS) ×3 IMPLANT
LABEL OR SOLS (LABEL) ×3 IMPLANT
MESH SYNTHETIC 4X6 SOFT BARD (Mesh General) ×1 IMPLANT
MESH SYNTHETIC SOFT BARD 4X6 (Mesh General) ×2 IMPLANT
NEEDLE HYPO 25X1 1.5 SAFETY (NEEDLE) ×3 IMPLANT
NS IRRIG 500ML POUR BTL (IV SOLUTION) ×3 IMPLANT
PACK BASIN MINOR ARMC (MISCELLANEOUS) ×3 IMPLANT
STAPLER SKIN PROX 35W (STAPLE) IMPLANT
SUT CHROMIC 3 0 SH 27 (SUTURE) ×3 IMPLANT
SUT MNCRL 4-0 (SUTURE) ×2
SUT MNCRL 4-0 27XMFL (SUTURE) ×1
SUT SURGILON 0 30 BLK (SUTURE) ×9 IMPLANT
SUTURE MNCRL 4-0 27XMF (SUTURE) ×1 IMPLANT
SYRINGE 10CC LL (SYRINGE) ×3 IMPLANT

## 2017-02-25 NOTE — OR Nursing (Signed)
Interpreter J Laukatis present for d/c insructions. Dr. Katrinka Blazing in to see pt @ 1108 am - interpreter J Laukatis present for same.

## 2017-02-25 NOTE — Anesthesia Procedure Notes (Signed)
Procedure Name: Intubation Date/Time: 02/25/2017 7:39 AM Performed by: Doreen Salvage Pre-anesthesia Checklist: Patient identified, Patient being monitored, Timeout performed, Emergency Drugs available and Suction available Patient Re-evaluated:Patient Re-evaluated prior to induction Oxygen Delivery Method: Circle system utilized Preoxygenation: Pre-oxygenation with 100% oxygen Induction Type: IV induction Ventilation: Mask ventilation without difficulty Laryngoscope Size: Mac and 3 Grade View: Grade I Tube type: Oral Tube size: 7.5 mm Number of attempts: 1 Airway Equipment and Method: Stylet Placement Confirmation: ETT inserted through vocal cords under direct vision,  positive ETCO2 and breath sounds checked- equal and bilateral Secured at: 23 cm Tube secured with: Tape Dental Injury: Teeth and Oropharynx as per pre-operative assessment

## 2017-02-25 NOTE — Op Note (Signed)
OPERATIVE REPORT  PREOPERATIVE  DIAGNOSIS: .Marland Kitchen Ventral hernia  POSTOPERATIVE DIAGNOSIS: .Marland Kitchen Ventral hernia  PROCEDURE: .Marland Kitchen Ventral hernia repair  ANESTHESIA:  General  SURGEON: Renda Rolls  MD   INDICATIONS: . He has a history of small bowel resection December 2016. In recent months he has been having bulging in the epigastrium slightly to the left of the midline. A ventral hernia was demonstrated on physical exam and repair was recommended for definitive treatment  The patient was placed on the operating table in the supine position under general endotracheal anesthesia. The abdomen was prepared with ChloraPrep and draped in a sterile manner. A longitudinally oriented incision was made in the old scar in the epigastrium with the lower end of the incision reaching the navel and extending slightly to the left of midline. The incision was carried down through subcutaneous tissues. Electrocautery was used for hemostasis. There was a ventral hernia sac with a defect which was found in the midline. The sac was dissected free from the fascial ring defect. There was small bowel adherent to the sac. The bowel was dissected away from the sac. Peritoneum was separated from the fascia circumferentially. A defect in the peritoneum was closed with running 3-0 chromic suture. Bard soft mesh was cut to create an oval shape of 2.5 x 3.5 cm and placed into the properitoneal plane oriented transversely. The mesh was sutured to the overlying fascia with through and through 0 Surgilon sutures. Next the fascial ring defect was closed with a transversely oriented suture line of interrupted 0 Surgilon figure-of-eight sutures incorporating each suture into the mesh. The deep fascia and subcuticular tissues were infiltrated with half percent Sensorcaine with epinephrine. The skin was closed with a running 4-0 Monocryl subcuticular suture and Dermabond  The patient tolerated surgery satisfactorily and was then prepared for  transfer to the recovery room  [employer].D.

## 2017-02-25 NOTE — Anesthesia Postprocedure Evaluation (Signed)
Anesthesia Post Note  Patient: Parker Bishop  Procedure(s) Performed: HERNIA REPAIR VENTRAL ADULT (N/A Abdomen)  Patient location during evaluation: PACU Anesthesia Type: General Level of consciousness: awake and alert Pain management: pain level controlled Vital Signs Assessment: post-procedure vital signs reviewed and stable Respiratory status: spontaneous breathing and respiratory function stable Cardiovascular status: stable Anesthetic complications: no     Last Vitals:  Vitals:   02/25/17 0602 02/25/17 0912  BP: (!) 156/93 (!) 148/89  Pulse: 76 95  Resp: 17 15  Temp: 36.6 C 37 C  SpO2: 98% 100%    Last Pain:  Vitals:   02/25/17 0935  TempSrc:   PainSc: (P) 4                  KEPHART,WILLIAM K

## 2017-02-25 NOTE — Discharge Instructions (Addendum)
Take Tylenol or Norco if needed for pain. ° °Should not drive or do anything dangerous when taking Norco. ° °May shower and blot dry. ° °Avoid straining and heavy lifting. ° °AMBULATORY SURGERY  °DISCHARGE INSTRUCTIONS ° ° °1) The drugs that you were given will stay in your system until tomorrow so for the next 24 hours you should not: ° °A) Drive an automobile °B) Make any legal decisions °C) Drink any alcoholic beverage ° ° °2) You may resume regular meals tomorrow.  Today it is better to start with liquids and gradually work up to solid foods. ° °You may eat anything you prefer, but it is better to start with liquids, then soup and crackers, and gradually work up to solid foods. ° ° °3) Please notify your doctor immediately if you have any unusual bleeding, trouble breathing, redness and pain at the surgery site, drainage, fever, or pain not relieved by medication. ° °4) Additional Instructions: ° ° °Please contact your physician with any problems or Same Day Surgery at [phone], Monday through Friday 6 am to 4 pm, or [place] [facility] [place] Main number at [phone]. °

## 2017-02-25 NOTE — Anesthesia Post-op Follow-up Note (Signed)
Anesthesia QCDR form completed.        

## 2017-02-25 NOTE — H&P (Signed)
  He comes for repair of ventral hernia.  He reports no change in condition since office exam.  The hernia was demonstrated in lower epigastrium adjacent of old scar on left.  Labs noted.  Discussed plan for surgery.

## 2017-02-25 NOTE — Transfer of Care (Signed)
Immediate Anesthesia Transfer of Care Note  Patient: Parker Bishop  Procedure(s) Performed: Procedure(s): HERNIA REPAIR VENTRAL ADULT (N/A)  Patient Location: PACU  Anesthesia Type:General  Level of Consciousness: sedated  Airway & Oxygen Therapy: Patient Spontanous Breathing and Patient connected to face mask oxygen  Post-op Assessment: Report given to RN and Post -op Vital signs reviewed and stable  Post vital signs: Reviewed and stable  Last Vitals:  Vitals:   02/25/17 0602 02/25/17 0912  BP: (!) 156/93 (!) 148/89  Pulse: 76 95  Resp: 17 15  Temp: 36.6 C 37 C  SpO2: 98% 100%    Complications: No apparent anesthesia complications

## 2017-02-25 NOTE — Anesthesia Preprocedure Evaluation (Signed)
Anesthesia Evaluation  Patient identified by MRN, date of birth, ID band Patient awake    Reviewed: Allergy & Precautions, NPO status , Patient's Chart, lab work & pertinent test results  History of Anesthesia Complications Negative for: history of anesthetic complications  Airway Mallampati: III       Dental   Pulmonary neg sleep apnea, neg COPD, former smoker,           Cardiovascular hypertension, Pt. on medications (-) Past MI and (-) CHF (-) dysrhythmias (-) Valvular Problems/Murmurs     Neuro/Psych neg Seizures    GI/Hepatic Neg liver ROS, GERD  Medicated and Controlled,  Endo/Other  neg diabetes  Renal/GU negative Renal ROS     Musculoskeletal   Abdominal   Peds  Hematology   Anesthesia Other Findings   Reproductive/Obstetrics                             Anesthesia Physical Anesthesia Plan  ASA: II  Anesthesia Plan: General   Post-op Pain Management:    Induction: Intravenous  PONV Risk Score and Plan:   Airway Management Planned: Oral ETT  Additional Equipment:   Intra-op Plan:   Post-operative Plan:   Informed Consent: I have reviewed the patients History and Physical, chart, labs and discussed the procedure including the risks, benefits and alternatives for the proposed anesthesia with the patient or authorized representative who has indicated his/her understanding and acceptance.     Plan Discussed with:   Anesthesia Plan Comments:         Anesthesia Quick Evaluation

## 2017-02-26 ENCOUNTER — Encounter: Payer: Self-pay | Admitting: Surgery

## 2017-03-15 DIAGNOSIS — B351 Tinea unguium: Secondary | ICD-10-CM | POA: Diagnosis not present

## 2017-03-15 DIAGNOSIS — K219 Gastro-esophageal reflux disease without esophagitis: Secondary | ICD-10-CM | POA: Diagnosis not present

## 2017-03-15 DIAGNOSIS — I10 Essential (primary) hypertension: Secondary | ICD-10-CM | POA: Diagnosis not present

## 2017-05-07 DIAGNOSIS — B351 Tinea unguium: Secondary | ICD-10-CM | POA: Diagnosis not present

## 2017-05-07 DIAGNOSIS — R7303 Prediabetes: Secondary | ICD-10-CM | POA: Diagnosis not present

## 2017-05-07 DIAGNOSIS — R972 Elevated prostate specific antigen [PSA]: Secondary | ICD-10-CM | POA: Diagnosis not present

## 2017-05-07 DIAGNOSIS — Z0000 Encounter for general adult medical examination without abnormal findings: Secondary | ICD-10-CM | POA: Diagnosis not present

## 2017-05-07 DIAGNOSIS — I10 Essential (primary) hypertension: Secondary | ICD-10-CM | POA: Diagnosis not present

## 2017-05-27 ENCOUNTER — Ambulatory Visit: Payer: Commercial Managed Care - PPO | Admitting: Urology

## 2017-05-28 DIAGNOSIS — Z0000 Encounter for general adult medical examination without abnormal findings: Secondary | ICD-10-CM | POA: Diagnosis not present

## 2017-09-25 IMAGING — US US EXTREM LOW*R* LIMITED
2 series · 14 of 25 positions shown · non-contrast
Comparison: CT abdomen/ pelvis 04/18/2015

CLINICAL DATA: 59-year-old male with and right-sided inguinal bulge

EXAM:
RIGHT LOWER EXTREMITY SOFT TISSUE ULTRASOUND LIMITED
TECHNIQUE: Ultrasound examination of the pelvic soft tissues was performed in
the area of clinical concern.

[Series 1: us extrem low*right* limited · 0.08mm/px · 8 of 20 slices shown (1 of 2)]
[im 1/20]
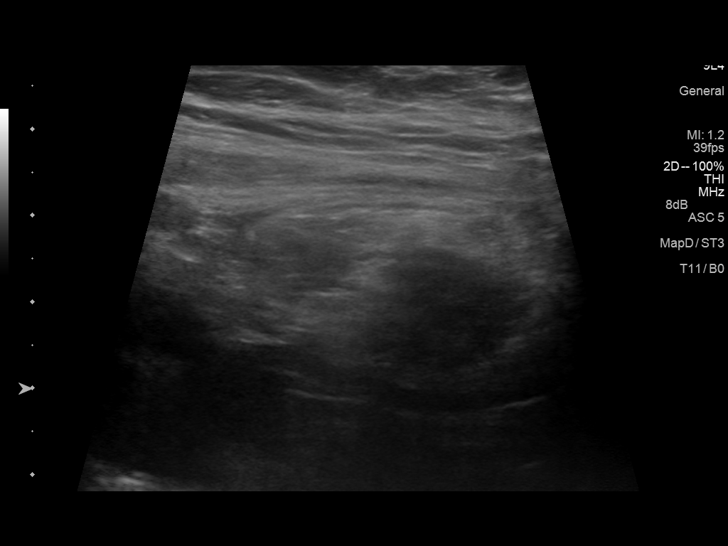
[im 3/20]
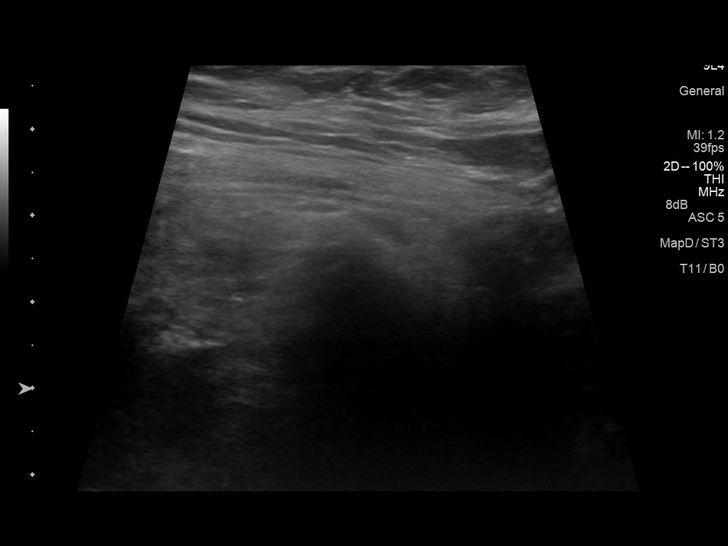
[im 6/20]
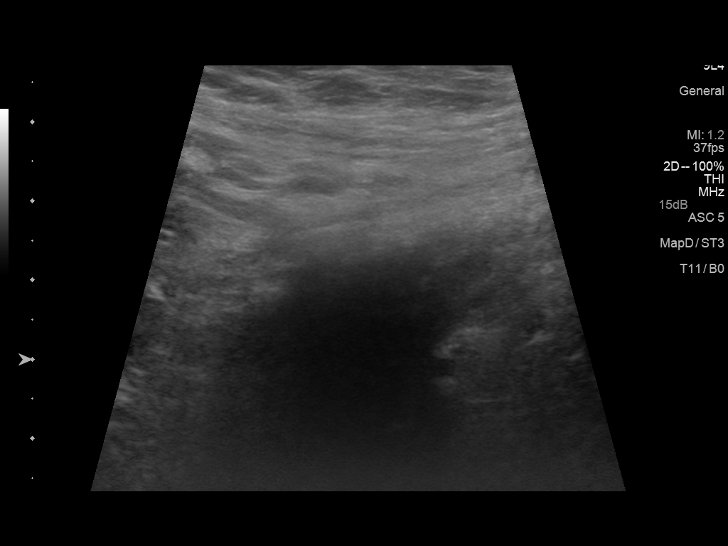
[im 9/20]
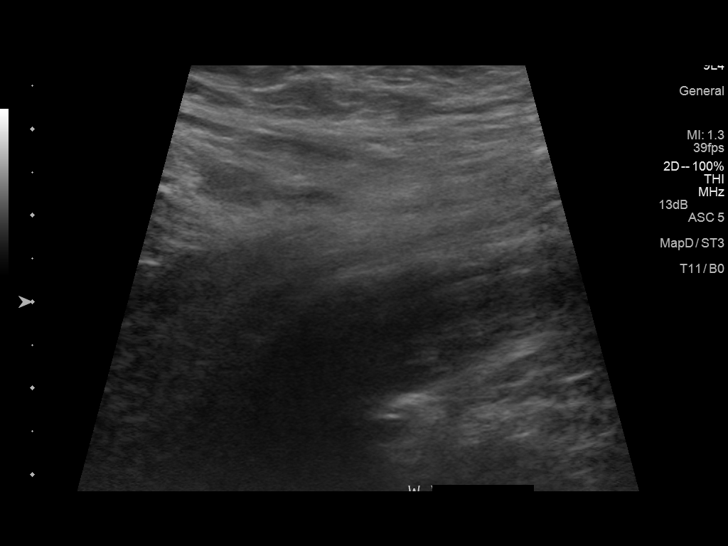
[im 12/20]
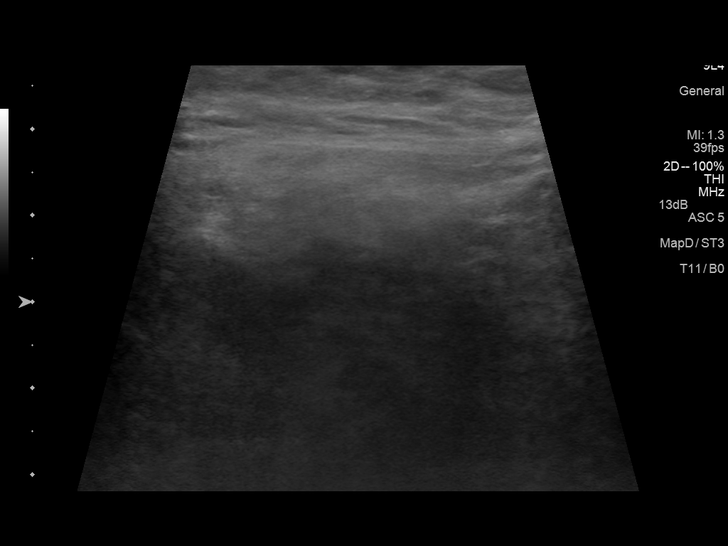
[im 14/20]
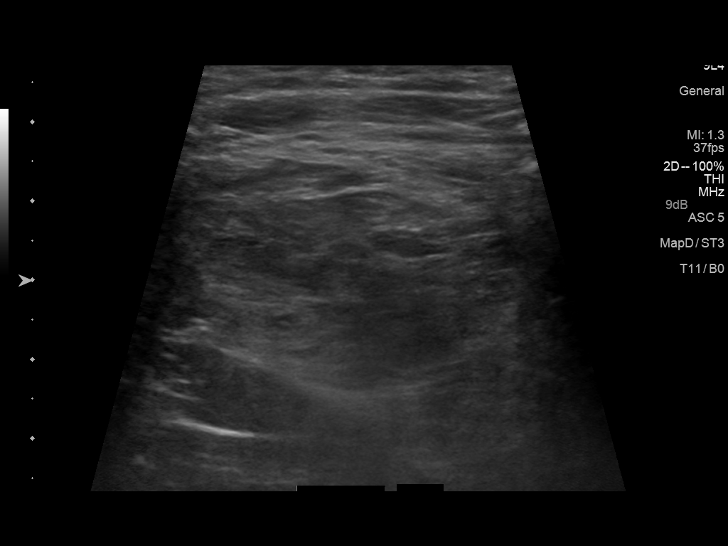
[im 17/20]
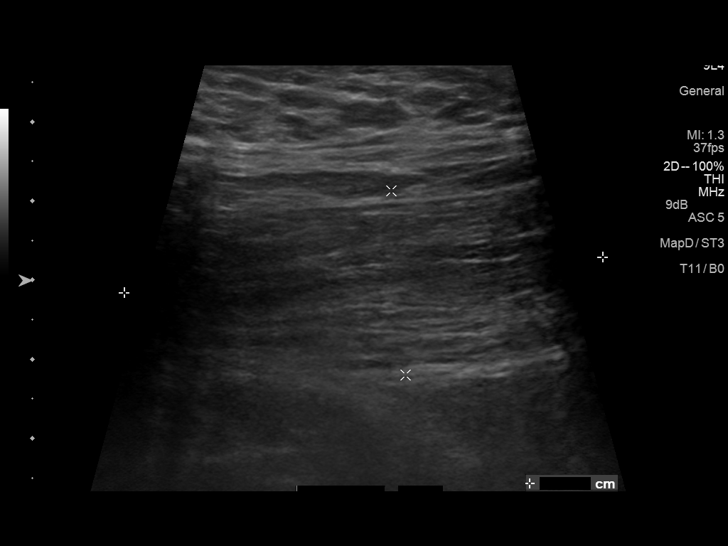
[im 20/20]
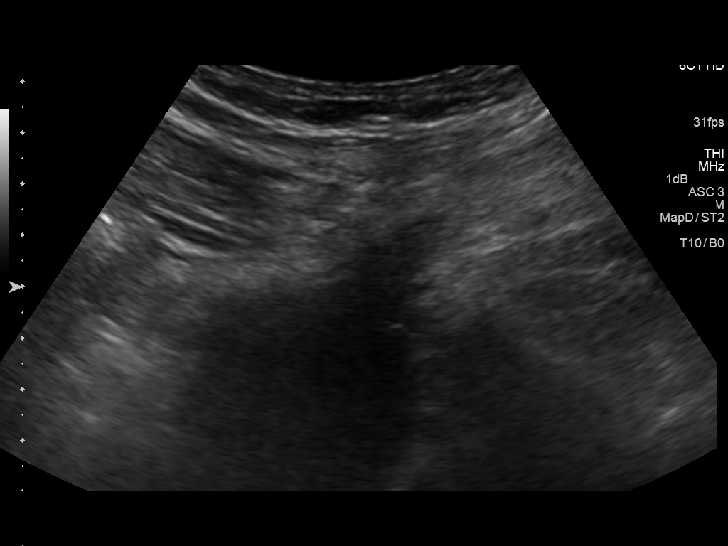

[Series 2: us extrem low*right* limited · 0.08mm/px · 6 of 15 slices shown (2 of 2)]
[im 2/15]
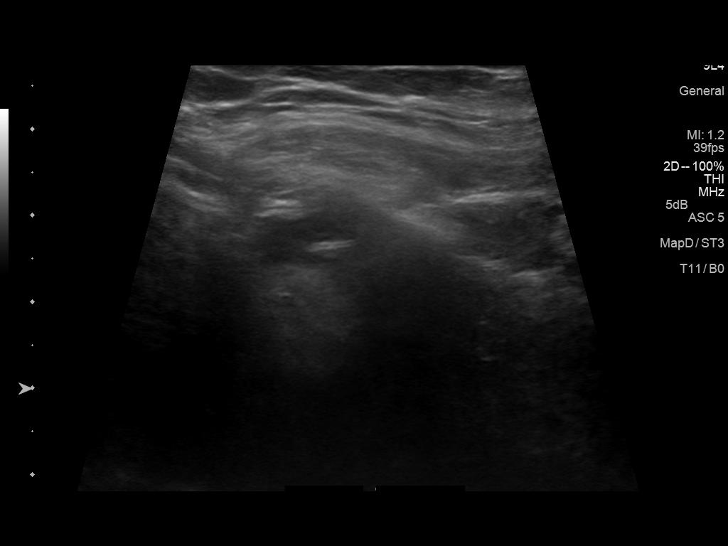
[im 3/15]
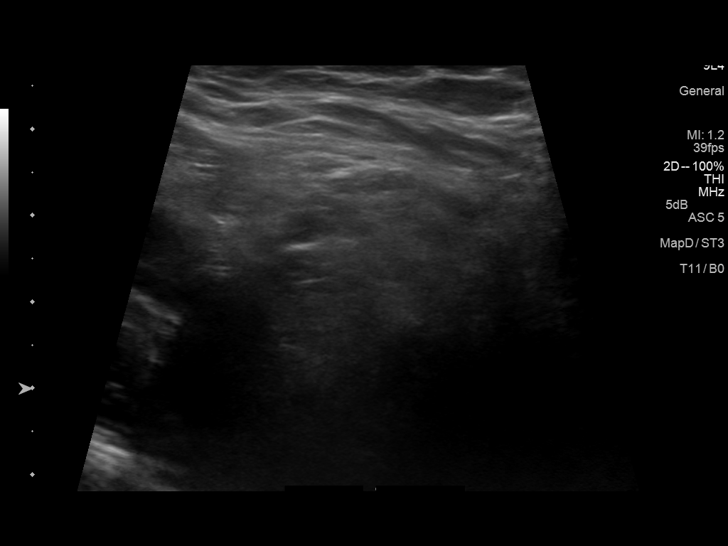
[im 6/15]
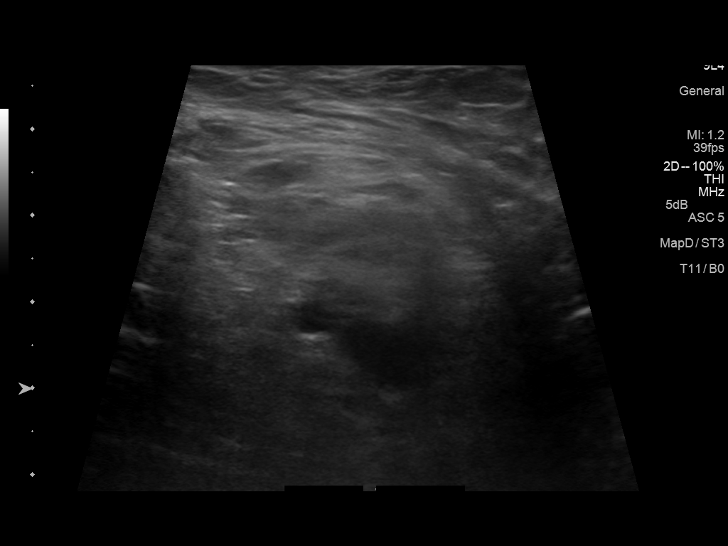
[im 9/15]
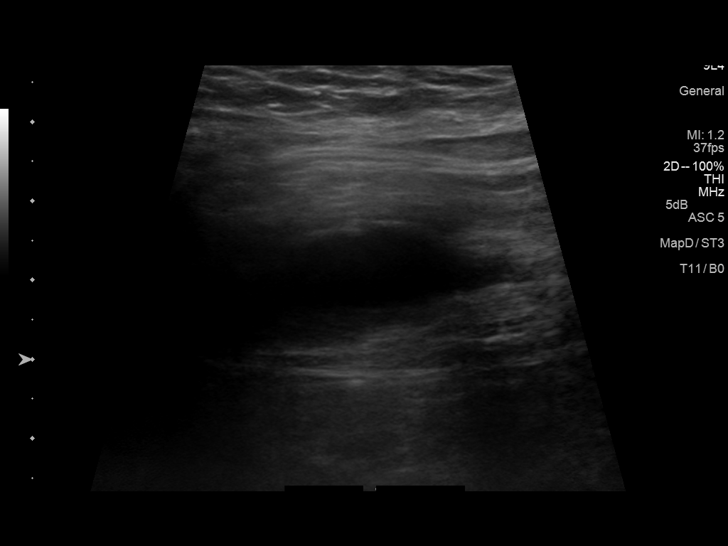
[im 12/15]
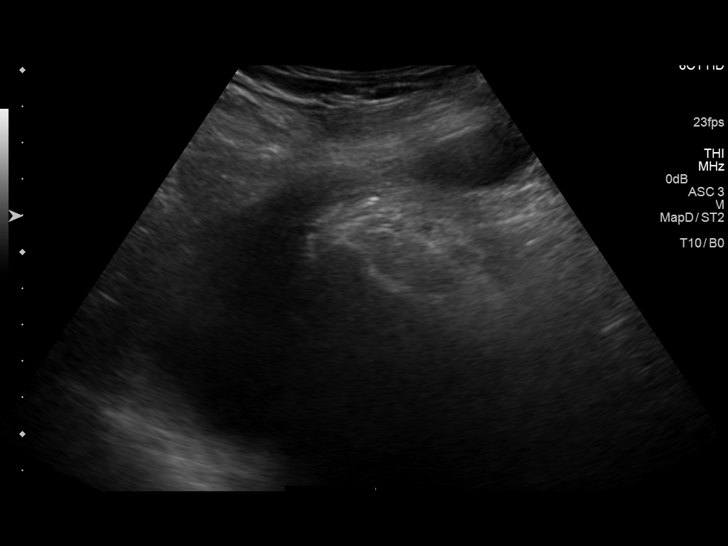
[im 15/15]
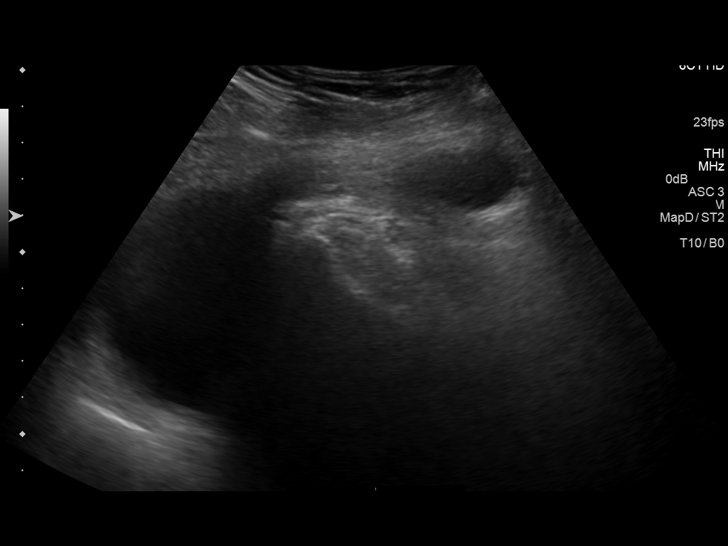

[14 of 25 positions shown; findings below may reference images not displayed]

FINDINGS: Sonographic interrogation of the right inguinal region demonstrates
echogenic fatty material extending from the abdominal cavity into
the inguinal canal consistent with omental herniation. Additionally,
when the patient is in the standing position the anterolateral
aspect of the bladder wall prolapses into the inguinal canal.
IMPRESSION: Omental fat and partial bladder containing right inguinal hernia.

## 2017-10-12 DIAGNOSIS — R202 Paresthesia of skin: Secondary | ICD-10-CM | POA: Diagnosis not present

## 2017-10-12 DIAGNOSIS — R300 Dysuria: Secondary | ICD-10-CM | POA: Diagnosis not present

## 2017-10-12 DIAGNOSIS — R972 Elevated prostate specific antigen [PSA]: Secondary | ICD-10-CM | POA: Diagnosis not present

## 2017-10-12 DIAGNOSIS — I10 Essential (primary) hypertension: Secondary | ICD-10-CM | POA: Diagnosis not present

## 2017-10-12 DIAGNOSIS — M79671 Pain in right foot: Secondary | ICD-10-CM | POA: Diagnosis not present

## 2017-10-12 DIAGNOSIS — M79672 Pain in left foot: Secondary | ICD-10-CM | POA: Diagnosis not present

## 2017-10-25 DIAGNOSIS — K219 Gastro-esophageal reflux disease without esophagitis: Secondary | ICD-10-CM | POA: Diagnosis not present

## 2017-10-25 DIAGNOSIS — M150 Primary generalized (osteo)arthritis: Secondary | ICD-10-CM | POA: Diagnosis not present

## 2018-01-25 DIAGNOSIS — R42 Dizziness and giddiness: Secondary | ICD-10-CM | POA: Diagnosis not present

## 2018-01-25 DIAGNOSIS — N401 Enlarged prostate with lower urinary tract symptoms: Secondary | ICD-10-CM | POA: Diagnosis not present

## 2018-01-25 DIAGNOSIS — I10 Essential (primary) hypertension: Secondary | ICD-10-CM | POA: Diagnosis not present

## 2018-03-25 DIAGNOSIS — M25542 Pain in joints of left hand: Secondary | ICD-10-CM | POA: Diagnosis not present

## 2018-03-25 DIAGNOSIS — M2550 Pain in unspecified joint: Secondary | ICD-10-CM | POA: Diagnosis not present

## 2018-03-25 DIAGNOSIS — M10042 Idiopathic gout, left hand: Secondary | ICD-10-CM | POA: Diagnosis not present

## 2018-03-25 DIAGNOSIS — R202 Paresthesia of skin: Secondary | ICD-10-CM | POA: Diagnosis not present

## 2018-03-25 DIAGNOSIS — M19042 Primary osteoarthritis, left hand: Secondary | ICD-10-CM | POA: Diagnosis not present

## 2018-03-25 DIAGNOSIS — M25572 Pain in left ankle and joints of left foot: Secondary | ICD-10-CM | POA: Diagnosis not present

## 2018-04-14 DIAGNOSIS — Z23 Encounter for immunization: Secondary | ICD-10-CM | POA: Diagnosis not present

## 2018-05-06 DIAGNOSIS — M85842 Other specified disorders of bone density and structure, left hand: Secondary | ICD-10-CM | POA: Diagnosis not present

## 2018-05-06 DIAGNOSIS — K219 Gastro-esophageal reflux disease without esophagitis: Secondary | ICD-10-CM | POA: Diagnosis not present

## 2018-05-06 DIAGNOSIS — M150 Primary generalized (osteo)arthritis: Secondary | ICD-10-CM | POA: Diagnosis not present

## 2018-05-23 DIAGNOSIS — M8588 Other specified disorders of bone density and structure, other site: Secondary | ICD-10-CM | POA: Diagnosis not present

## 2018-09-22 DIAGNOSIS — N401 Enlarged prostate with lower urinary tract symptoms: Secondary | ICD-10-CM | POA: Diagnosis not present

## 2018-09-22 DIAGNOSIS — R0981 Nasal congestion: Secondary | ICD-10-CM | POA: Diagnosis not present

## 2018-09-22 DIAGNOSIS — I10 Essential (primary) hypertension: Secondary | ICD-10-CM | POA: Diagnosis not present

## 2018-12-08 ENCOUNTER — Other Ambulatory Visit: Payer: Self-pay

## 2018-12-08 DIAGNOSIS — Z20822 Contact with and (suspected) exposure to covid-19: Secondary | ICD-10-CM

## 2018-12-14 LAB — NOVEL CORONAVIRUS, NAA: SARS-CoV-2, NAA: NOT DETECTED

## 2019-06-10 ENCOUNTER — Other Ambulatory Visit: Payer: Self-pay

## 2019-06-10 ENCOUNTER — Emergency Department: Payer: Medicare Other

## 2019-06-10 ENCOUNTER — Emergency Department
Admission: EM | Admit: 2019-06-10 | Discharge: 2019-06-10 | Disposition: A | Discharge status: Home / Self Care | Payer: Medicare Other | Attending: Student in an Organized Health Care Education/Training Program | Admitting: Student in an Organized Health Care Education/Training Program

## 2019-06-10 ENCOUNTER — Encounter: Payer: Self-pay | Admitting: Emergency Medicine

## 2019-06-10 DIAGNOSIS — R531 Weakness: Secondary | ICD-10-CM | POA: Diagnosis not present

## 2019-06-10 DIAGNOSIS — R519 Headache, unspecified: Secondary | ICD-10-CM | POA: Diagnosis present

## 2019-06-10 DIAGNOSIS — R438 Other disturbances of smell and taste: Secondary | ICD-10-CM | POA: Insufficient documentation

## 2019-06-10 DIAGNOSIS — Z87891 Personal history of nicotine dependence: Secondary | ICD-10-CM | POA: Diagnosis not present

## 2019-06-10 DIAGNOSIS — J069 Acute upper respiratory infection, unspecified: Secondary | ICD-10-CM | POA: Diagnosis not present

## 2019-06-10 DIAGNOSIS — R200 Anesthesia of skin: Secondary | ICD-10-CM | POA: Diagnosis not present

## 2019-06-10 DIAGNOSIS — Z79899 Other long term (current) drug therapy: Secondary | ICD-10-CM | POA: Insufficient documentation

## 2019-06-10 DIAGNOSIS — U071 COVID-19: Secondary | ICD-10-CM | POA: Diagnosis not present

## 2019-06-10 DIAGNOSIS — I10 Essential (primary) hypertension: Secondary | ICD-10-CM | POA: Diagnosis not present

## 2019-06-10 LAB — COMPREHENSIVE METABOLIC PANEL
ALT: 27 U/L (ref 0–44)
AST: 27 U/L (ref 15–41)
Albumin: 3.9 g/dL (ref 3.5–5.0)
Alkaline Phosphatase: 77 U/L (ref 38–126)
Anion gap: 10 (ref 5–15)
BUN: 26 mg/dL — ABNORMAL HIGH (ref 8–23)
CO2: 21 mmol/L — ABNORMAL LOW (ref 22–32)
Calcium: 8.2 mg/dL — ABNORMAL LOW (ref 8.9–10.3)
Chloride: 101 mmol/L (ref 98–111)
Creatinine, Ser: 1.29 mg/dL — ABNORMAL HIGH (ref 0.61–1.24)
GFR calc Af Amer: >60 mL/min (ref >60)
GFR calc non Af Amer: 58 mL/min — ABNORMAL LOW (ref >60)
Glucose, Bld: 113 mg/dL — ABNORMAL HIGH (ref 70–99)
Potassium: 4.3 mmol/L (ref 3.5–5.1)
Sodium: 132 mmol/L — ABNORMAL LOW (ref 135–145)
Total Bilirubin: 0.8 mg/dL (ref 0.3–1.2)
Total Protein: 7.3 g/dL (ref 6.5–8.1)

## 2019-06-10 LAB — URINALYSIS, COMPLETE (UACMP) WITH MICROSCOPIC
Bacteria, UA: NONE SEEN
Bilirubin Urine: NEGATIVE
Glucose, UA: NEGATIVE mg/dL
Hgb urine dipstick: NEGATIVE
Ketones, ur: 5 mg/dL — AB
Leukocytes,Ua: NEGATIVE
Nitrite: NEGATIVE
Protein, ur: 30 mg/dL — AB
Specific Gravity, Urine: 1.031 — ABNORMAL HIGH (ref 1.005–1.030)
pH: 5 (ref 5.0–8.0)

## 2019-06-10 LAB — CBC
HCT: 38.7 % — ABNORMAL LOW (ref 39.0–52.0)
Hemoglobin: 13.3 g/dL (ref 13.0–17.0)
MCH: 29.5 pg (ref 26.0–34.0)
MCHC: 34.4 g/dL (ref 30.0–36.0)
MCV: 85.8 fL (ref 80.0–100.0)
Platelets: 220 10*3/uL (ref 150–400)
RBC: 4.51 MIL/uL (ref 4.22–5.81)
RDW: 12.8 % (ref 11.5–15.5)
WBC: 5.2 10*3/uL (ref 4.0–10.5)
nRBC: 0 % (ref 0.0–0.2)

## 2019-06-10 LAB — LIPASE, BLOOD: Lipase: 53 U/L — ABNORMAL HIGH (ref 11–51)

## 2019-06-10 MED ORDER — ALBUTEROL SULFATE HFA 108 (90 BASE) MCG/ACT IN AERS: 2.0000 | INHALATION_SPRAY | Freq: Four times a day (QID) | RESPIRATORY_TRACT | 0 refills | Status: AC | PRN

## 2019-06-10 MED ORDER — BENZONATATE 100 MG PO CAPS: ORAL_CAPSULE | ORAL | 0 refills | Status: DC

## 2019-06-10 MED ORDER — AZITHROMYCIN 250 MG PO TABS: 250.0000 mg | ORAL_TABLET | Freq: Every day | ORAL | 0 refills | Status: AC

## 2019-06-10 MED ORDER — PREDNISONE 20 MG PO TABS: ORAL_TABLET | ORAL | 0 refills | Status: DC

## 2019-06-10 MED ORDER — SODIUM CHLORIDE 0.9% FLUSH: 3.0000 mL | Freq: Once | INTRAVENOUS | Status: DC

## 2019-06-10 MED ORDER — AZITHROMYCIN 500 MG PO TABS
500.0000 mg | ORAL_TABLET | Freq: Once | ORAL | Status: AC
  Administered 2019-06-10: 13:00:00 500 mg via ORAL
  Filled 2019-06-10: qty 1

## 2019-06-10 MED ORDER — AZITHROMYCIN 250 MG PO TABS: 250.0000 mg | ORAL_TABLET | Freq: Every day | ORAL | 0 refills | Status: DC

## 2019-06-10 MED ORDER — PREDNISONE 20 MG PO TABS
60.0000 mg | ORAL_TABLET | Freq: Once | ORAL | Status: AC
  Administered 2019-06-10: 13:00:00 60 mg via ORAL
  Filled 2019-06-10: qty 3

## 2019-06-10 NOTE — ED Notes (Signed)
Pt states he lost his sensation of taste 5 days ago and has not been able to eat r/t a sour taste w/ food. Pt states he has also experienced intermittant headaches and body aches. Pt states he had one episode of SOB on Tuesday and one episode of fever on Wednesday but did not record w/ thermometer. Ignacia Bayley #[id number] teleinterpreter assisted.

## 2019-06-10 NOTE — ED Triage Notes (Signed)
Pt to ED via POV stating that he has lost his taste and that he has a headache. Pt states that he has had numbness in both legs since Monday. Pt states that when he stands up he feels like he is going to fall due to the numbness. Pt states that he has hx/o arthritis. Pt denies similar sxs in the past. Pt is in NAD.

## 2019-06-10 NOTE — ED Provider Notes (Signed)
[facility] Emergency Department Provider Note ____________________________________________  Time seen: 1209  I have reviewed the triage vital signs and the nursing notes.  HISTORY  Chief Complaint  Generalized Body Aches and covid sx  History limited by Spanish language. Interpreter Fabian Sharp) present for interview and exam.   HPI Parker Bishop is a 64 y.o. male presents himself to the ED for evaluation of symptoms that include headache and taste sensation loss.  Patient also describes "numbness"  in both legs since Monday.  He feels like his legs are weak, and that he is going to fall when he stands on his legs secondary to the "numbness."  He denies bladder/bowel incontinence, foot drop, or saddle anesthesias. He has generalized muscle pain and LBP. Patient has a history of arthritis, but denies any similar symptoms in the past.  Past Medical History:  Diagnosis Date  . Arthritis   . Bowel perforation (HCC)   . Diverticulitis   . Hypertension   . 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 Neuropathy   . Tremors of nervous system     Patient Active Problem List   Diagnosis Date Noted  . Prediabetes 04/10/2016  . Elevated PSA, less than 10 ng/ml 04/10/2016  . Pain and swelling of toe of right foot 04/02/2016  . Pain in both feet 04/02/2016  . Essential hypertension 04/02/2016  . Hernia of abdominal cavity   . Abdominal pain 06/05/2015  . Right inguinal hernia 06/05/2015  . Perforated viscus 04/09/2015    Past Surgical History:  Procedure Laterality Date  . BOWEL RESECTION N/A 04/09/2015   Procedure: SMALL BOWEL RESECTION;  Surgeon: Lattie Haw, MD;  Location: [facility] ORS;  Service: General;  Laterality: N/A;  . COLON SURGERY    . HERNIA REPAIR     right inguinal  . INGUINAL HERNIA REPAIR Right 07/11/2015   Procedure: LAPAROSCOPIC INGUINAL HERNIA;  Surgeon: Lattie Haw, MD;  Location: [facility] ORS;  Service: General;   Laterality: Right;  . LAPAROTOMY N/A 04/09/2015   Procedure: EXPLORATORY LAPAROTOMY;  Surgeon: Lattie Haw, MD;  Location: [facility] ORS;  Service: General;  Laterality: N/A;  . VENTRAL HERNIA REPAIR N/A 02/25/2017   Procedure: HERNIA REPAIR VENTRAL ADULT;  Surgeon: Nadeen Landau, MD;  Location: [facility] ORS;  Service: General;  Laterality: N/A;    Prior to Admission medications   Medication Sig Start Date End Date Taking? Authorizing Provider  acetaminophen (TYLENOL) 500 MG tablet Take 500 mg by mouth every 6 (six) hours as needed.    [provider]  albuterol (VENTOLIN HFA) 108 (90 Base) MCG/ACT inhaler Inhale 2 puffs into the lungs every 6 (six) hours as needed. 06/10/19   Lynk Marti, [name], PA-C  azithromycin (ZITHROMAX Z-PAK) 250 MG tablet Take 1 tablet (250 mg total) by mouth daily for 4 days. 06/11/19 06/15/19  Maidie Streight, [name], PA-C  benzonatate (TESSALON PERLES) 100 MG capsule Take 1-2 tabs TID prn cough 06/10/19   Shelbylynn Walczyk, [name], PA-C  HYDROcodone-acetaminophen (NORCO) 5-325 MG tablet Take 1-2 tablets by mouth every 6 (six) hours as needed for moderate pain. 02/25/17   Nadeen Landau, MD  lisinopril (PRINIVIL,ZESTRIL) 20 MG tablet Take 20 mg by mouth daily.    [provider]  predniSONE (DELTASONE) 20 MG tablet Take 3 tabs daily x 2 days; Take 2 tabs daily x 3 days; Take 1 tab daily x 3 days; Take 0.5 tabs daily x 4 days  06/11/19   Cresencia Asmus, Dannielle Karvonen, PA-C    Allergies Patient has no known allergies.  Family History  Problem Relation Age of Onset  . Arthritis Mother   . Diverticulitis Father   . Kidney cancer Father   . Prostate cancer Neg Hx     Social History Social History   Tobacco Use  . Smoking status: Former Smoker    Quit date: 04/23/1989    Years since quitting: 30.1  . Smokeless tobacco: Never Used  Substance Use Topics  . Alcohol use: No    Alcohol/week: 0.0 standard drinks  . Drug use: No    Review of  Systems  Constitutional: Positive for fever. Eyes: Negative for visual changes. ENT: Negative for sore throat.  Cardiovascular: Negative for chest pain. Respiratory: Negative for shortness of breath. Gastrointestinal: Negative for abdominal pain, vomiting and diarrhea. Genitourinary: Negative for dysuria. Musculoskeletal: Negative for back pain. Reports bodyaches.  Skin: Negative for rash. Neurological: Negative for headaches, focal weakness.  Reports bilateral LE weakness and taste sensation change. ____________________________________________  PHYSICAL EXAM:  VITAL SIGNS: ED Triage Vitals  Enc Vitals Group     BP 06/10/19 1027 122/74     Pulse Rate 06/10/19 1027 90     Resp 06/10/19 1027 16     Temp 06/10/19 1027 98.6 F (37 C)     Temp Source 06/10/19 1027 Oral     SpO2 06/10/19 1027 97 %     Weight --      Height --      Head Circumference --      Peak Flow --      Pain Score 06/10/19 1028 4     Pain Loc --      Pain Edu? --      Excl. in [place]? --     Constitutional: Alert and oriented. Well appearing and in no distress. Head: Normocephalic and atraumatic. Eyes: Conjunctivae are normal. Normal extraocular movements Cardiovascular: Normal rate, regular rhythm. Normal distal pulses. Respiratory: Normal respiratory effort. No wheezes/rales/rhonchi. Gastrointestinal: Soft and nontender. No distention. Musculoskeletal: Nontender with normal range of motion in all extremities.  Neurologic:  Normal gait without ataxia. Normal speech and language. No gross focal neurologic deficits are appreciated. Skin:  Skin is warm, dry and intact. No rash noted. Psychiatric: Mood and affect are normal. Patient exhibits appropriate insight and judgment. ____________________________________________   LABS (pertinent positives/negatives) Labs Reviewed  LIPASE, BLOOD - Abnormal; Notable for the following components:      Result Value   Lipase 53 (*)    All other components within normal  limits  COMPREHENSIVE METABOLIC PANEL - Abnormal; Notable for the following components:   Sodium 132 (*)    CO2 21 (*)    Glucose, Bld 113 (*)    BUN 26 (*)    Creatinine, Ser 1.29 (*)    Calcium 8.2 (*)    GFR calc non Af Amer 58 (*)    All other components within normal limits  CBC - Abnormal; Notable for the following components:   HCT 38.7 (*)    All other components within normal limits  URINALYSIS, COMPLETE (UACMP) WITH MICROSCOPIC - Abnormal; Notable for the following components:   Color, Urine AMBER (*)    APPearance HAZY (*)    Specific Gravity, Urine 1.031 (*)    Ketones, ur 5 (*)    Protein, ur 30 (*)    All other components within normal limits  SARS CORONAVIRUS 2 (TAT 6-24 HRS)  ____________________________________________   RADIOLOGY  CXR 1V IMPRESSION: 1. Subtle changes in the lungs, which could suggest developing multilobar bilateral pneumonia. Correlation with forthcoming COVID-19 test is recommended. 2. Aortic atherosclerosis. ____________________________________________  PROCEDURES  Prednisone 60 mg PO Azithromycin 500 mg PO  Procedures ____________________________________________  INITIAL IMPRESSION / ASSESSMENT AND PLAN / ED COURSE  Patient with ED evaluation of symptoms concerning for probable Covid exposure and diagnosis.  Patient describes loss of taste sensation as well as persistent headache.  He has also had body aches and leg weakness.  His exam is overall benign reassuring at this time.  Patient's chest x-ray is consistent with early subtle changes likely consistent with his suspected Covid infection. Labs not indicate any acute infectious process, dehydration, or signs of systemic infection.  Patient will be treated empirically for his symptoms with prednisone, azithromycin, Tessalon Perles, albuterol inhaler.  He is encouraged to continue to monitor symptoms and await test results.  Return precautions have been discussed.  Angelina Brueggemann  was evaluated in Emergency Department on 06/10/2019 for the symptoms described in the history of present illness. He was evaluated in the context of the global COVID-19 pandemic, which necessitated consideration that the patient might be at risk for infection with the SARS-CoV-2 virus that causes COVID-19. Institutional protocols and algorithms that pertain to the evaluation of patients at risk for COVID-19 are in a state of rapid change based on information released by regulatory bodies including the CDC and federal and state organizations. These policies and algorithms were followed during the patient's care in the ED. ____________________________________________  FINAL CLINICAL IMPRESSION(S) / ED DIAGNOSES  Final diagnoses:  Viral upper respiratory tract infection  Clinical diagnosis of COVID-19      Lissa Hoard, PA-C 06/10/19 1334    [name], MD 06/10/19 1340

## 2019-06-10 NOTE — Discharge Instructions (Addendum)
You are being treated for suspected COVID. Take the prescription meds as directed. Remain at home under house Quarantine, until your symptoms improve. Follow-up with Dr. Thedore Mins as needed.   Est siendo tratado por sospecha de COVID. Tome los medicamentos recetados segn las indicaciones. Permanezca en casa bajo cuarentena domiciliaria, hasta que sus sntomas mejoren. Haga un seguimiento con el Dr. Thedore Mins segn sea necesario.

## 2019-06-11 LAB — SARS CORONAVIRUS 2 (TAT 6-24 HRS): SARS Coronavirus 2: POSITIVE — AB

## 2019-06-12 ENCOUNTER — Telehealth: Payer: Self-pay | Admitting: Emergency Medicine

## 2019-06-12 NOTE — Telephone Encounter (Signed)
Called patient and informed of positive coivd 19 restult.  Says his wife has it too.  Says he feels okay.  Says he understands to isolate at home for 10 days.

## 2020-08-24 ENCOUNTER — Other Ambulatory Visit: Payer: Self-pay

## 2020-08-24 ENCOUNTER — Emergency Department
Admission: EM | Admit: 2020-08-24 | Discharge: 2020-08-24 | Disposition: A | Discharge status: Home / Self Care | Payer: Medicare Other | Attending: Student in an Organized Health Care Education/Training Program | Admitting: Student in an Organized Health Care Education/Training Program

## 2020-08-24 DIAGNOSIS — J029 Acute pharyngitis, unspecified: Secondary | ICD-10-CM | POA: Insufficient documentation

## 2020-08-24 DIAGNOSIS — R059 Cough, unspecified: Secondary | ICD-10-CM | POA: Insufficient documentation

## 2020-08-24 DIAGNOSIS — Z5321 Procedure and treatment not carried out due to patient leaving prior to being seen by health care provider: Secondary | ICD-10-CM | POA: Diagnosis not present

## 2020-08-24 NOTE — ED Triage Notes (Addendum)
Pt comes pov with sore throat and cough for 2 days. Painful to swallow to eat. NAD. Interpreter on a stick used.

## 2020-08-24 NOTE — ED Notes (Signed)
Interpreter paged.

## 2020-08-24 NOTE — ED Notes (Signed)
Patient left the room unseen before PA could assess him. Spanish Interpreter Marchelle Folks tried to call the patient on his phone, but there was no answer. Patient was in the room for registration, but then left after signing.

## 2020-08-24 NOTE — ED Notes (Signed)
Interpreter at bedside. Patient not found in patient's room. This RN looked in triage, ED, bathrooms, and surrounding area. Patient not found. Provider notified.

## 2021-05-07 ENCOUNTER — Encounter: Payer: Self-pay | Admitting: *Deleted

## 2021-07-15 DIAGNOSIS — I10 Essential (primary) hypertension: Secondary | ICD-10-CM | POA: Diagnosis not present

## 2021-07-15 DIAGNOSIS — U071 COVID-19: Secondary | ICD-10-CM | POA: Insufficient documentation

## 2021-07-15 DIAGNOSIS — R059 Cough, unspecified: Secondary | ICD-10-CM | POA: Diagnosis present

## 2021-07-16 ENCOUNTER — Emergency Department
Admission: EM | Admit: 2021-07-16 | Discharge: 2021-07-16 | Disposition: A | Discharge status: Home / Self Care | Payer: Medicare Other | Attending: Emergency Medicine | Admitting: Emergency Medicine

## 2021-07-16 ENCOUNTER — Encounter: Payer: Self-pay | Admitting: Emergency Medicine

## 2021-07-16 ENCOUNTER — Other Ambulatory Visit: Payer: Self-pay

## 2021-07-16 ENCOUNTER — Emergency Department: Payer: Medicare Other

## 2021-07-16 DIAGNOSIS — U071 COVID-19: Secondary | ICD-10-CM

## 2021-07-16 LAB — RESP PANEL BY RT-PCR (FLU A&B, COVID) ARPGX2
Influenza A by PCR: NEGATIVE
Influenza B by PCR: NEGATIVE
SARS Coronavirus 2 by RT PCR: POSITIVE — AB

## 2021-07-16 MED ORDER — NIRMATRELVIR/RITONAVIR (PAXLOVID)TABLET
3.0000 | ORAL_TABLET | Freq: Two times a day (BID) | ORAL | 0 refills | Status: AC
Start: 2021-07-16 — End: 2021-07-21

## 2021-07-16 MED ORDER — BENZONATATE 100 MG PO CAPS
100.0000 mg | ORAL_CAPSULE | Freq: Two times a day (BID) | ORAL | 0 refills | Status: AC | PRN
Start: 2021-07-16 — End: 2021-07-21

## 2021-07-16 NOTE — ED Provider Notes (Signed)
[facility] Provider Note    Event Date/Time   First MD Initiated Contact with Patient 07/16/21 0202     (approximate)   History   Cough   HPI  Parker Bishop is a 66 y.o. male with a past medical history of HTN, arthritis, diverticulitis and peripheral neuropathy who presents for assessment of 3 to 4 days of headache, myalgias, cough and decreased appetite.  He also has had some subjective fevers.  He denies any acute associated chest pain, nausea, vomiting, diarrhea, abdominal pain, urinary symptoms, rash or extremity pain.  No tobacco abuse.  States he has been taking Tylenol ibuprofen but denies any other locations.  No other acute concerns at this time.   Past Medical History:  Diagnosis Date   Arthritis    Bowel perforation (HCC)    Diverticulitis    Hypertension    Neuromuscular disorder (HCC)    hands and feet neuropathy   Neuropathy    Tremors of nervous system          Physical Exam  Triage Vital Signs: ED Triage Vitals  Enc Vitals Group     BP 07/16/21 0039 118/70     Pulse Rate 07/16/21 0039 79     Resp 07/16/21 0039 20     Temp 07/16/21 0039 98.4 F (36.9 C)     Temp Source 07/16/21 0039 Oral     SpO2 07/16/21 0039 97 %     Weight --      Height --      Head Circumference --      Peak Flow --      Pain Score 07/16/21 0040 7     Pain Loc --      Pain Edu? --      Excl. in GC? --     Most recent vital signs: Vitals:   07/16/21 0039 07/16/21 0211  BP: 118/70 129/64  Pulse: 79 79  Resp: 20 (!) 21  Temp: 98.4 F (36.9 C) 98.2 F (36.8 C)  SpO2: 97% 96%    General: Awake, no distress.  CV:  Good peripheral perfusion.  2+ radial pulse. Resp:  Normal effort.  Clear bilaterally without any increased effort. Abd:  No distention.  Soft. Other:  Oropharynx is unremarkable.     ED Results / Procedures / Treatments  Labs (all labs ordered are listed, but only abnormal results are displayed) Labs Reviewed  RESP  PANEL BY RT-PCR (FLU A&B, COVID) ARPGX2 - Abnormal; Notable for the following components:      Result Value   SARS Coronavirus 2 by RT PCR POSITIVE (*)    All other components within normal limits     EKG  ECG shows sinus rhythm with a ventricular rate of 95, normal axis, unremarkable intervals without evidence of acute ischemia or significant arrhythmia   RADIOLOGY Chest reviewed by myself shows no focal consoidation, effusion, edema, pneumothorax or other clear acute thoracic process. I also reviewed radiology interpretation and agree with findings described.    PROCEDURES:  Critical Care performed: No  Procedures    MEDICATIONS ORDERED IN ED: Medications - No data to display   IMPRESSION / MDM / ASSESSMENT AND PLAN / ED COURSE  I reviewed the triage vital signs and the nursing notes.                              Differential diagnosis includes, but is not  limited to pneumonia, viral bronchitis with etiologies including flu and influenza.  Patient does not appear septic or toxic and has no history of obstructive airway disease or wheezing on exam to suggest COPD or asthma exacerbation.  No history of CHF or findings of volume overload to suggest CHF exacerbation.  ECG without evidence of arrhythmia or ischemia.  He does not appear significantly dehydrated and have a low sufficient for significant metabolic arrangement.  Chest reviewed by myself shows no focal consoidation, effusion, edema, pneumothorax or other clear acute thoracic process. I also reviewed radiology interpretation and agree with findings described.  COVID PCR is positive.  Will start patient on Paxil bid.  Discussed isolation and using NSAIDs and Tylenol for myalgias fever and headache.  Discussed importance of outpatient follow-up.  Discharged in stable condition.  Strict return precautions advised and discussed.  Rx for Tessalon and Paxlovid prescribed.      FINAL CLINICAL IMPRESSION(S) / ED DIAGNOSES    Final diagnoses:  COVID     Rx / DC Orders   ED Discharge Orders          Ordered    nirmatrelvir/ritonavir EUA (PAXLOVID) 20 x 150 MG & 10 x 100MG  TABS  2 times daily        07/16/21 0241    benzonatate (TESSALON PERLES) 100 MG capsule  2 times daily PRN        07/16/21 0241             Note:  This document was prepared using Dragon voice recognition software and may include unintentional dictation errors.   07/18/21, MD 07/16/21 [phone]

## 2021-07-16 NOTE — ED Triage Notes (Signed)
Patient ambulatory to triage with steady gait, without difficulty or distress noted; pt reports cough, congestion and HA today

## 2021-10-21 ENCOUNTER — Emergency Department
Admission: EM | Admit: 2021-10-21 | Discharge: 2021-10-21 | Disposition: A | Discharge status: Home / Self Care | Payer: Medicare Other | Attending: Student in an Organized Health Care Education/Training Program | Admitting: Student in an Organized Health Care Education/Training Program

## 2021-10-21 ENCOUNTER — Emergency Department: Payer: Medicare Other

## 2021-10-21 ENCOUNTER — Other Ambulatory Visit: Payer: Self-pay

## 2021-10-21 DIAGNOSIS — R5381 Other malaise: Secondary | ICD-10-CM | POA: Diagnosis not present

## 2021-10-21 DIAGNOSIS — Z20822 Contact with and (suspected) exposure to covid-19: Secondary | ICD-10-CM | POA: Insufficient documentation

## 2021-10-21 DIAGNOSIS — R197 Diarrhea, unspecified: Secondary | ICD-10-CM | POA: Diagnosis present

## 2021-10-21 DIAGNOSIS — R112 Nausea with vomiting, unspecified: Secondary | ICD-10-CM | POA: Diagnosis not present

## 2021-10-21 LAB — COMPREHENSIVE METABOLIC PANEL
ALT: 28 U/L (ref 0–44)
AST: 24 U/L (ref 15–41)
Albumin: 4.3 g/dL (ref 3.5–5.0)
Alkaline Phosphatase: 78 U/L (ref 38–126)
Anion gap: 10 (ref 5–15)
BUN: 42 mg/dL — ABNORMAL HIGH (ref 8–23)
CO2: 22 mmol/L (ref 22–32)
Calcium: 8.7 mg/dL — ABNORMAL LOW (ref 8.9–10.3)
Chloride: 102 mmol/L (ref 98–111)
Creatinine, Ser: 1.8 mg/dL — ABNORMAL HIGH (ref 0.61–1.24)
GFR, Estimated: 41 mL/min — ABNORMAL LOW (ref >60)
Glucose, Bld: 124 mg/dL — ABNORMAL HIGH (ref 70–99)
Potassium: 3.8 mmol/L (ref 3.5–5.1)
Sodium: 134 mmol/L — ABNORMAL LOW (ref 135–145)
Total Bilirubin: 0.8 mg/dL (ref 0.3–1.2)
Total Protein: 7.8 g/dL (ref 6.5–8.1)

## 2021-10-21 LAB — URINALYSIS, ROUTINE W REFLEX MICROSCOPIC
Bilirubin Urine: NEGATIVE
Glucose, UA: NEGATIVE mg/dL
Hgb urine dipstick: NEGATIVE
Ketones, ur: NEGATIVE mg/dL
Nitrite: NEGATIVE
Protein, ur: NEGATIVE mg/dL
Specific Gravity, Urine: 1.025 (ref 1.005–1.030)
pH: 5 (ref 5.0–8.0)

## 2021-10-21 LAB — CBC
HCT: 45.1 % (ref 39.0–52.0)
Hemoglobin: 15.1 g/dL (ref 13.0–17.0)
MCH: 29.6 pg (ref 26.0–34.0)
MCHC: 33.5 g/dL (ref 30.0–36.0)
MCV: 88.4 fL (ref 80.0–100.0)
Platelets: 300 10*3/uL (ref 150–400)
RBC: 5.1 MIL/uL (ref 4.22–5.81)
RDW: 13.5 % (ref 11.5–15.5)
WBC: 7.1 10*3/uL (ref 4.0–10.5)
nRBC: 0 % (ref 0.0–0.2)

## 2021-10-21 LAB — LIPASE, BLOOD: Lipase: 43 U/L (ref 11–51)

## 2021-10-21 LAB — SARS CORONAVIRUS 2 BY RT PCR: SARS Coronavirus 2 by RT PCR: NEGATIVE

## 2021-10-21 LAB — LACTIC ACID, PLASMA: Lactic Acid, Venous: 1 mmol/L (ref 0.5–1.9)

## 2021-10-21 MED ORDER — IOHEXOL 300 MG/ML  SOLN
100.0000 mL | Freq: Once | INTRAMUSCULAR | Status: AC | PRN
  Administered 2021-10-21: 100 mL via INTRAVENOUS

## 2021-10-21 MED ORDER — ONDANSETRON HCL 4 MG/2ML IJ SOLN
4.0000 mg | Freq: Once | INTRAMUSCULAR | Status: AC
  Administered 2021-10-21: 4 mg via INTRAVENOUS
  Filled 2021-10-21: qty 2

## 2021-10-21 MED ORDER — ONDANSETRON 4 MG PO TBDP
4.0000 mg | ORAL_TABLET | Freq: Three times a day (TID) | ORAL | 0 refills | Status: AC | PRN
Start: 2021-10-21 — End: ?

## 2021-10-21 MED ORDER — SODIUM CHLORIDE 0.9 % IV BOLUS
1000.0000 mL | Freq: Once | INTRAVENOUS | Status: AC
  Administered 2021-10-21: 1000 mL via INTRAVENOUS

## 2021-10-21 MED ORDER — OXYCODONE-ACETAMINOPHEN 5-325 MG PO TABS
1.0000 | ORAL_TABLET | Freq: Once | ORAL | Status: AC
  Administered 2021-10-21: 1 via ORAL
  Filled 2021-10-21: qty 1

## 2021-10-21 MED ORDER — MORPHINE SULFATE (PF) 4 MG/ML IV SOLN
4.0000 mg | INTRAVENOUS | Status: DC | PRN
  Administered 2021-10-21: 4 mg via INTRAVENOUS
  Filled 2021-10-21: qty 1

## 2021-10-21 NOTE — ED Notes (Signed)
Received report from [employer]. Pt cleared PO challenge and cleared for discharge.

## 2021-10-21 NOTE — ED Triage Notes (Addendum)
Pt c/o N/V/D with body aches, head ache that started last night. With generalized abd pain

## 2021-10-21 NOTE — ED Provider Notes (Signed)
[facility] Provider Note    Event Date/Time   First MD Initiated Contact with Patient 10/21/21 1607     (approximate)   History   Emesis and Diarrhea   HPI  Parker Bishop is a 66 y.o. male history of previous abdominal surgeries including hernia repair presents to the ER for evaluation of more than 24 hours of nausea diarrhea abdominal pain generalized malaise.  Has had multiple episodes of watery diarrhea.  States he did have blood-tinged diarrhea last night but that is since subsided.  No known sick contacts denies any cough or congestion.  Has been having chills at home.  States the pain is mild and crampy in nature.     Physical Exam   Triage Vital Signs: ED Triage Vitals  Enc Vitals Group     BP 10/21/21 1329 110/70     Pulse Rate 10/21/21 1329 (!) 107     Resp 10/21/21 1329 17     Temp 10/21/21 1329 97.7 F (36.5 C)     Temp Source 10/21/21 1329 Oral     SpO2 10/21/21 1329 96 %     Weight 10/21/21 1331 175 lb (79.4 kg)     Height --      Head Circumference --      Peak Flow --      Pain Score 10/21/21 1330 8     Pain Loc --      Pain Edu? --      Excl. in GC? --     Most recent vital signs: Vitals:   10/21/21 1329 10/21/21 1749  BP: 110/70 100/65  Pulse: (!) 107 99  Resp: 17 18  Temp: 97.7 F (36.5 C)   SpO2: 96% 95%     Constitutional: Alert  Eyes: Conjunctivae are normal.  Head: Atraumatic. Nose: No congestion/rhinnorhea. Mouth/Throat: Mucous membranes are moist.   Neck: Painless ROM.  Cardiovascular:   Good peripheral circulation. Respiratory: Normal respiratory effort.  No retractions.  Gastrointestinal: Soft with mild epigastric ttp Musculoskeletal:  no deformity Neurologic:  MAE spontaneously. No gross focal neurologic deficits are appreciated.  Skin:  Skin is warm, dry and intact. No rash noted. Psychiatric: Mood and affect are normal. Speech and behavior are normal.    ED Results / Procedures / Treatments    Labs (all labs ordered are listed, but only abnormal results are displayed) Labs Reviewed  COMPREHENSIVE METABOLIC PANEL - Abnormal; Notable for the following components:      Result Value   Sodium 134 (*)    Glucose, Bld 124 (*)    BUN 42 (*)    Creatinine, Ser 1.80 (*)    Calcium 8.7 (*)    GFR, Estimated 41 (*)    All other components within normal limits  URINALYSIS, ROUTINE W REFLEX MICROSCOPIC - Abnormal; Notable for the following components:   Color, Urine AMBER (*)    APPearance HAZY (*)    Leukocytes,Ua MODERATE (*)    Bacteria, UA RARE (*)    All other components within normal limits  SARS CORONAVIRUS 2 BY RT PCR  LIPASE, BLOOD  CBC  LACTIC ACID, PLASMA  LACTIC ACID, PLASMA     EKG     RADIOLOGY Please see ED Course for my review and interpretation.  I personally reviewed all radiographic images ordered to evaluate for the above acute complaints and reviewed radiology reports and findings.  These findings were personally discussed with the patient.  Please see medical record for radiology  report.    PROCEDURES:  Critical Care performed:   Procedures   MEDICATIONS ORDERED IN ED: Medications  morphine (PF) 4 MG/ML injection 4 mg (4 mg Intravenous Given 10/21/21 1724)  sodium chloride 0.9 % bolus 1,000 mL (1,000 mLs Intravenous New Bag/Given 10/21/21 1724)  ondansetron (ZOFRAN) injection 4 mg (4 mg Intravenous Given 10/21/21 1724)  sodium chloride 0.9 % bolus 1,000 mL (1,000 mLs Intravenous New Bag/Given 10/21/21 1724)  iohexol (OMNIPAQUE) 300 MG/ML solution 100 mL (100 mLs Intravenous Contrast Given 10/21/21 1734)  oxyCODONE-acetaminophen (PERCOCET/ROXICET) 5-325 MG per tablet 1 tablet (1 tablet Oral Given 10/21/21 1823)     IMPRESSION / MDM / ASSESSMENT AND PLAN / ED COURSE  I reviewed the triage vital signs and the nursing notes.                              Differential diagnosis includes, but is not limited to, enteritis, colitis, diverticulitis,  appendicitis, SBO, perforation, abscess, dehydration, electrolyte  Patient presented to the ER for evaluation of crampy abdominal pain with diarrhea as described above.  He is nontoxic-appearing but given his past medical history and presenting complaints  htis presentation could reflect a potentially life-threatening illness therefore the patient will be placed on continuous pulse oximetry and telemetry for monitoring.  Laboratory evaluation will be sent to evaluate for the above complaints.   CT imaging will be ordered for the above differential.  Will give IV morphine as well as IV antiemetic and IV fluids    Clinical Course as of 10/21/21 1858  Tue Oct 21, 2021  1818 CT imaging on my interpretation does not show any evidence of appendicitis or stone will await formal radiology report. [PR]  [id number] Patient reassessed.  Feels significantly improved. [PR]  1855 Patient tolerating p.o.  Repeat abdominal exam is soft benign.  At this point do believe he stable and appropriate for outpatient follow-up.  We discussed supportive care focusing on staying hydrated in the setting of diarrheal illness.  Patient states understanding and is agreeable to plan. [PR]    Clinical Course User Index [PR] Merlyn Lot, MD      FINAL CLINICAL IMPRESSION(S) / ED DIAGNOSES   Final diagnoses:  Diarrhea, unspecified type     Rx / DC Orders   ED Discharge Orders          Ordered    ondansetron (ZOFRAN-ODT) 4 MG disintegrating tablet  Every 8 hours PRN        10/21/21 1855             Note:  This document was prepared using Dragon voice recognition software and may include unintentional dictation errors.    Merlyn Lot, MD 10/21/21 [phone]

## 2022-06-19 ENCOUNTER — Encounter: Admission: RE | Payer: Self-pay | Source: Home / Self Care

## 2022-06-19 ENCOUNTER — Ambulatory Visit: Admission: RE | Admit: 2022-06-19 | Payer: Medicare Other | Source: Home / Self Care

## 2022-06-19 SURGERY — COLONOSCOPY WITH PROPOFOL
Anesthesia: General

## 2023-04-10 ENCOUNTER — Emergency Department
Admission: EM | Admit: 2023-04-10 | Discharge: 2023-04-10 | Disposition: A | Discharge status: Home / Self Care | Payer: Medicare Other | Attending: Emergency Medicine | Admitting: Emergency Medicine

## 2023-04-10 ENCOUNTER — Other Ambulatory Visit: Payer: Self-pay

## 2023-04-10 ENCOUNTER — Emergency Department: Payer: Medicare Other

## 2023-04-10 DIAGNOSIS — M21611 Bunion of right foot: Secondary | ICD-10-CM | POA: Insufficient documentation

## 2023-04-10 DIAGNOSIS — I10 Essential (primary) hypertension: Secondary | ICD-10-CM | POA: Diagnosis not present

## 2023-04-10 DIAGNOSIS — M79604 Pain in right leg: Secondary | ICD-10-CM | POA: Diagnosis present

## 2023-04-10 LAB — CBC
HCT: 40.5 % (ref 39.0–52.0)
Hemoglobin: 13.8 g/dL (ref 13.0–17.0)
MCH: 29.6 pg (ref 26.0–34.0)
MCHC: 34.1 g/dL (ref 30.0–36.0)
MCV: 86.7 fL (ref 80.0–100.0)
Platelets: 244 10*3/uL (ref 150–400)
RBC: 4.67 MIL/uL (ref 4.22–5.81)
RDW: 13.5 % (ref 11.5–15.5)
WBC: 5.3 10*3/uL (ref 4.0–10.5)
nRBC: 0 % (ref 0.0–0.2)

## 2023-04-10 LAB — BASIC METABOLIC PANEL
Anion gap: 8 (ref 5–15)
BUN: 18 mg/dL (ref 8–23)
CO2: 24 mmol/L (ref 22–32)
Calcium: 8.6 mg/dL — ABNORMAL LOW (ref 8.9–10.3)
Chloride: 106 mmol/L (ref 98–111)
Creatinine, Ser: 0.74 mg/dL (ref 0.61–1.24)
GFR, Estimated: >60 mL/min (ref >60)
Glucose, Bld: 143 mg/dL — ABNORMAL HIGH (ref 70–99)
Potassium: 3.4 mmol/L — ABNORMAL LOW (ref 3.5–5.1)
Sodium: 138 mmol/L (ref 135–145)

## 2023-04-10 MED ORDER — NAPROXEN 500 MG PO TABS: 500.0000 mg | ORAL_TABLET | Freq: Two times a day (BID) | ORAL | 0 refills | Status: AC

## 2023-04-10 NOTE — ED Triage Notes (Addendum)
Pt to ED via POV from home. Pt reports right leg/foot pain that started x2 days ago. Pt denies injury. Pt denies CP or SOB. Pt reports pain in worse in posterior calf. Pt has been out of BP meds.

## 2023-04-10 NOTE — ED Notes (Signed)
[place] tech informed this RN that pt refused [place], states he doesn't want it just wants to get bump on foot looked at.

## 2023-04-10 NOTE — ED Provider Notes (Signed)
[facility] Provider Note    Event Date/Time   First MD Initiated Contact with Patient 04/10/23 [phone]     (approximate)   History   Leg Pain (Right )   HPI  Parker Bishop is a 67 y.o. male who presents today with request for bunion surgery.  Patient reports that he has had a bunion to his right foot for several months and would like to have it removed.  He denies any injury.  He has not noticed any numbness, tingling, swelling.  He has not noticed any discoloration.  He has not yet seen a podiatrist.  Patient Active Problem List   Diagnosis Date Noted   Prediabetes 04/10/2016   Elevated PSA, less than 10 ng/ml 04/10/2016   Pain and swelling of toe of right foot 04/02/2016   Pain in both feet 04/02/2016   Essential hypertension 04/02/2016   Hernia of abdominal cavity    Abdominal pain 06/05/2015   Right inguinal hernia 06/05/2015   Perforated viscus 04/09/2015          Physical Exam   Triage Vital Signs: ED Triage Vitals [04/10/23 0837]  Encounter Vitals Group     BP (!) 166/85     Systolic BP Percentile      Diastolic BP Percentile      Pulse Rate 88     Resp 18     Temp 98.1 F (36.7 C)     Temp Source Oral     SpO2      Weight      Height      Head Circumference      Peak Flow      Pain Score 9     Pain Loc      Pain Education      Exclude from Growth Chart     Most recent vital signs: Vitals:   04/10/23 0837  BP: (!) 166/85  Pulse: 88  Resp: 18  Temp: 98.1 F (36.7 C)    Physical Exam Vitals and nursing note reviewed.  Constitutional:      General: Awake and alert. No acute distress.    Appearance: Normal appearance. The patient is normal weight.  HENT:     Head: Normocephalic and atraumatic.     Mouth: Mucous membranes are moist.  Eyes:     General: PERRL. Normal EOMs        Right eye: No discharge.        Left eye: No discharge.     Conjunctiva/sclera: Conjunctivae normal.  Cardiovascular:     Rate and  Rhythm: Normal rate and regular rhythm.     Pulses: Normal pulses.  Pulmonary:     Effort: Pulmonary effort is normal. No respiratory distress.     Breath sounds: Normal breath sounds.  Abdominal:     Abdomen is soft. There is no abdominal tenderness. No rebound or guarding. No distention. Musculoskeletal:        General: No swelling. Normal range of motion.     Cervical back: Normal range of motion and neck supple.  Right foot great toe with valgus deviation and visible bunion.  No erythema or warmth.  Normal 2+ pedal pulse.  Sensation intact light touch.  Normal range of motion of all toes and ankle.  Normal capillary refill.  Normal plantarflexion and dorsiflexion against resistance.  No calf tenderness.  No leg swelling.  Foot is warm well-perfused. Skin:    General: Skin is warm and dry.  Capillary Refill: Capillary refill takes less than 2 seconds.     Findings: No rash.  Neurological:     Mental Status: The patient is awake and alert.      ED Results / Procedures / Treatments   Labs (all labs ordered are listed, but only abnormal results are displayed) Labs Reviewed  BASIC METABOLIC PANEL - Abnormal; Notable for the following components:      Result Value   Potassium 3.4 (*)    Glucose, Bld 143 (*)    Calcium 8.6 (*)    All other components within normal limits  CBC     EKG     RADIOLOGY Patient declined    PROCEDURES:  Critical Care performed:   Procedures   MEDICATIONS ORDERED IN ED: Medications - No data to display   IMPRESSION / MDM / ASSESSMENT AND PLAN / ED COURSE  I reviewed the triage vital signs and the nursing notes.   Differential diagnosis includes, but is not limited to, bunion, fracture, dislocation, osteoarthritis.  Patient is awake and alert, hemodynamically stable and afebrile.  He has normal 2+ pedal pulses, normal sensation, normal capillary minus foot is warm and well-perfused, I do not suspect arterial occlusion.  He has no  calf tenderness or swelling not consistent with DVT.  Patient reports that he is here only because he would like to be referred to podiatry and does not want any further workup.  However labs were obtained in triage and they are within normal limits.  Triage had originally ordered an ultrasound which she declined.  I recommended an x-ray was that we are able to better visualize the bones where he feels he is having a problem, though he declined this x-ray.  He is requesting pain medication and podiatry contact information, both of which were provided.  We discussed return precautions and outpatient follow-up.  Patient understands and agrees with plan.  He was discharged in stable condition.  Spanish interpreter was used for full history, physical exam, discussion of recommendations, results, follow-up instructions, and return precautions.   Patient's presentation is most consistent with acute complicated illness / injury requiring diagnostic workup.      FINAL CLINICAL IMPRESSION(S) / ED DIAGNOSES   Final diagnoses:  Bunion of great toe of right foot     Rx / DC Orders   ED Discharge Orders          Ordered    naproxen (NAPROSYN) 500 MG tablet  2 times daily with meals        04/10/23 0102             Note:  This document was prepared using Dragon voice recognition software and may include unintentional dictation errors.   Keturah Shavers 04/10/23 1136    Chesley Noon, MD 04/10/23 1436

## 2023-04-10 NOTE — Discharge Instructions (Addendum)
You did not wish to have any imaging today.  Please follow-up with podiatry.  You may take the medication as prescribed to help with your pain.  Please return for any new, worsening, or change in symptoms or other concerns.  It was a pleasure caring for you today.

## 2023-04-10 NOTE — ED Notes (Signed)
Pt taken to US

## 2023-04-23 ENCOUNTER — Other Ambulatory Visit: Payer: Self-pay

## 2023-04-23 ENCOUNTER — Emergency Department
Admission: EM | Admit: 2023-04-23 | Discharge: 2023-04-23 | Disposition: A | Discharge status: Home / Self Care | Payer: Medicare Other | Attending: Emergency Medicine | Admitting: Emergency Medicine

## 2023-04-23 ENCOUNTER — Emergency Department: Payer: Medicare Other

## 2023-04-23 ENCOUNTER — Encounter: Payer: Self-pay | Admitting: Emergency Medicine

## 2023-04-23 DIAGNOSIS — M25461 Effusion, right knee: Secondary | ICD-10-CM | POA: Diagnosis not present

## 2023-04-23 DIAGNOSIS — M1711 Unilateral primary osteoarthritis, right knee: Secondary | ICD-10-CM | POA: Diagnosis not present

## 2023-04-23 DIAGNOSIS — M25561 Pain in right knee: Secondary | ICD-10-CM | POA: Diagnosis present

## 2023-04-23 MED ORDER — PREDNISONE 10 MG PO TABS: 10.0000 mg | ORAL_TABLET | Freq: Every day | ORAL | 0 refills | Status: AC

## 2023-04-23 MED ORDER — HYDROCODONE-ACETAMINOPHEN 5-325 MG PO TABS
1.0000 | ORAL_TABLET | ORAL | Status: AC
  Administered 2023-04-23: 1 via ORAL
  Filled 2023-04-23: qty 1

## 2023-04-23 NOTE — Discharge Instructions (Signed)
Please ice and elevate the right lower leg.  Use Ace wrap for compression as needed for comfort to help with pain and swelling.  Call orthopedic office on Monday to schedule follow-up appointment.  Take prednisone as prescribed for 6 days.  Return to the ER for any fevers increasing pain swelling warmth redness or any urgent changes in your health

## 2023-04-23 NOTE — ED Triage Notes (Addendum)
Pt reports continued right leg/foot pain as had from previous visit last week.  States he now cannot walk.  Pain is located around right knee and upper leg as well now.

## 2023-04-23 NOTE — ED Provider Notes (Signed)
[facility] Provider Note   CSN: [id number] Arrival date & time: 04/23/23  2020     History  Chief Complaint  Patient presents with   Leg Pain    Parker Bishop is a 67 y.o. male.  Presents to the emergency department evaluation of right knee pain.  Patient has had pain in the right knee for several days.  No known trauma or injury.  Has a hard time bending the knee which is causing him to have pain in his thigh.  His knee is swollen.  Denies any warmth redness or fevers.  He has not been taking medications except for Tylenol.  He is able to ambulate but has a limp  HPI     Home Medications Prior to Admission medications   Medication Sig Start Date End Date Taking? Authorizing Provider  predniSONE (DELTASONE) 10 MG tablet Take 1 tablet (10 mg total) by mouth daily. 6,5,4,3,2,1 six day taper 04/23/23  Yes [name], PA-C  acetaminophen (TYLENOL) 500 MG tablet Take 500 mg by mouth every 6 (six) hours as needed.    [provider]  albuterol (VENTOLIN HFA) 108 (90 Base) MCG/ACT inhaler Inhale 2 puffs into the lungs every 6 (six) hours as needed. 06/10/19   Menshew, [name], PA-C  lisinopril (PRINIVIL,ZESTRIL) 20 MG tablet Take 20 mg by mouth daily.    [provider]  ondansetron (ZOFRAN-ODT) 4 MG disintegrating tablet Take 1 tablet (4 mg total) by mouth every 8 (eight) hours as needed for nausea or vomiting. 10/21/21   [name], MD      Allergies    Patient has no known allergies.    Review of Systems   Review of Systems  Physical Exam Updated Vital Signs BP (!) 165/91 (BP Location: Left Arm)   Pulse 81   Temp 97.9 F (36.6 C) (Oral)   Resp 18   SpO2 94%  Physical Exam Constitutional:      Appearance: He is well-developed.  HENT:     Head: Normocephalic and atraumatic.  Eyes:     Conjunctiva/sclera: Conjunctivae normal.  Cardiovascular:     Rate and Rhythm: Normal rate.  Pulmonary:      Effort: Pulmonary effort is normal. No respiratory distress.  Musculoskeletal:        General: Normal range of motion.     Cervical back: Normal range of motion.     Comments: Ambulatory antalgic gait.  Right lower extremity no tenderness throughout the hip or thigh.  He is tender throughout the knee with moderate effusion noted.  No warmth or redness.  No swelling or tenderness throughout the lower leg or calf.  Negative Homans' sign.  Ankle plantarflexion dorsiflexion is intact.  He has full hip range of motion with internal/external rotation with no discomfort.  Knee is stable to valgus and varus stress testing and he is able to actively straight leg raise with no defect in quad tendon or patellar tendon.  Skin:    General: Skin is warm.     Findings: No rash.  Neurological:     Mental Status: He is alert and oriented to person, place, and time.  Psychiatric:        Behavior: Behavior normal.        Thought Content: Thought content normal.     ED Results / Procedures / Treatments   Labs (all labs ordered are listed, but only abnormal results are displayed) Labs Reviewed - No data  to display  EKG None  Radiology DG Knee Complete 4 Views Right  Result Date: 04/23/2023 CLINICAL DATA:  Right knee pain and swelling. EXAM: RIGHT KNEE - COMPLETE 4+ VIEW COMPARISON:  None Available. FINDINGS: Mild degenerative changes in the right knee with medial greater than lateral compartment narrowing and small osteophyte formation. No evidence of acute fracture or dislocation. No focal bone lesion or bone destruction. Bone cortex appears intact. Large right knee effusion. IMPRESSION: Mild degenerative changes in the right knee. Large effusion. No acute bony abnormalities. Electronically Signed   By: Burman Nieves M.D.   On: 04/23/2023 21:29    Procedures .Joint Aspiration/Arthrocentesis  Date/Time: 04/23/2023 10:06 PM  Performed by: [name], PA-C Authorized by: [name],  PA-C   Consent:    Consent obtained:  Verbal   Consent given by:  Patient   Risks discussed:  Infection, bleeding and incomplete drainage Location:    Location:  Knee   Knee:  R knee Anesthesia:    Anesthesia method:  Local infiltration   Local anesthetic:  Lidocaine 1% WITH epi Procedure details:    Preparation: Patient was prepped and draped in usual sterile fashion     Needle gauge:  18 G   Ultrasound guidance: no     Approach:  Superior   Aspirate amount:  60   Aspirate characteristics:  Clear and yellow   Steroid injected: no     Specimen collected: no   Post-procedure details:    Dressing:  Adhesive bandage and gauze roll   Procedure completion:  Tolerated well, no immediate complications     Medications Ordered in ED Medications  HYDROcodone-acetaminophen (NORCO/VICODIN) 5-325 MG per tablet 1 tablet (1 tablet Oral Given 04/23/23 2117)    ED Course/ Medical Decision Making/ A&P                                 Medical Decision Making Amount and/or Complexity of Data Reviewed Radiology: ordered.  Risk Prescription drug management.   67 year old male with nontraumatic right knee effusion.  No warmth redness or fevers.  Vital signs stable.  No reports of trauma or injury.  X-rays did show some tricompartmental arthritic changes.  He has no ligamentous laxity or exam.  60 cc of normal synovial fluid aspirated from the knee and patient with significant improvement in pain.  Ace wrap was applied for comfort.  He is placed on steroid taper and will follow-up with orthopedics.  He is given strict return precautions and understands signs symptoms return to the ER for. Final Clinical Impression(s) / ED Diagnoses Final diagnoses:  Primary osteoarthritis of right knee  Effusion, right knee    Rx / DC Orders ED Discharge Orders          Ordered    predniSONE (DELTASONE) 10 MG tablet  Daily        04/23/23 2202              Ronnette Juniper 04/23/23  2207    Pilar Jarvis, MD 04/24/23 [phone]

## 2023-10-23 IMAGING — CT CT ABD-PELV W/ CM
2 of 5 series · 15 of 46 positions shown, 17 images · IV contrast (APPLIED)
Comparison: CT examination dated June 23, 2016

CLINICAL DATA: Abdominal pain.

EXAM:
CT ABDOMEN AND PELVIS WITH CONTRAST
TECHNIQUE: Multidetector CT imaging of the abdomen and pelvis was performed
using the standard protocol following bolus administration of
intravenous contrast.

[Series 2: abdomen 5.0 · axial · 0.98mm/px · z∈[-1038,-548]mm · 12 of 114 slices shown, 14 images]
[im 8/114  soft-tissue]
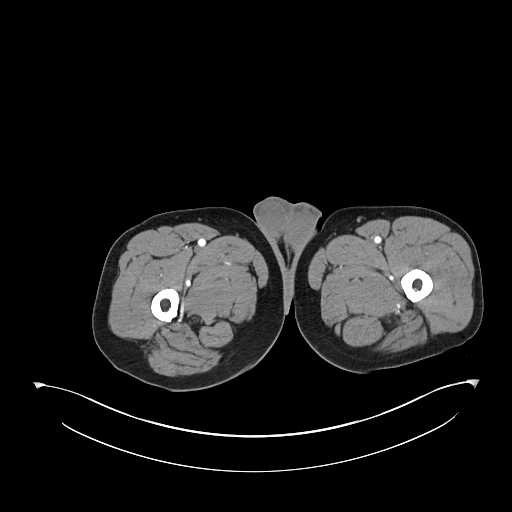
[im 8/114  bone]
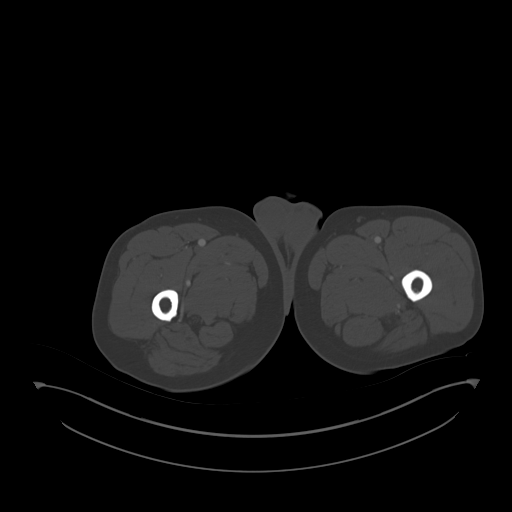
[im 16/114  soft-tissue]
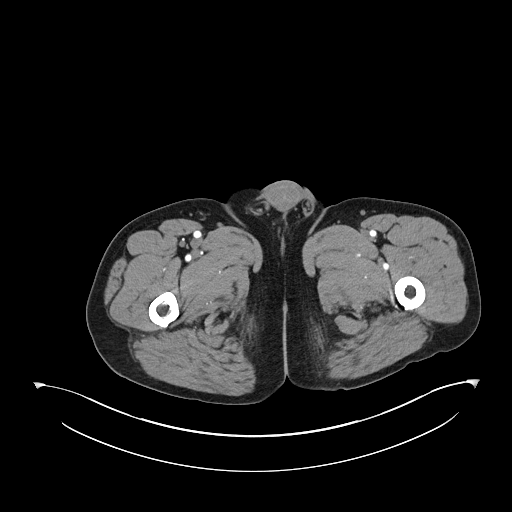
[im 23/114  soft-tissue]
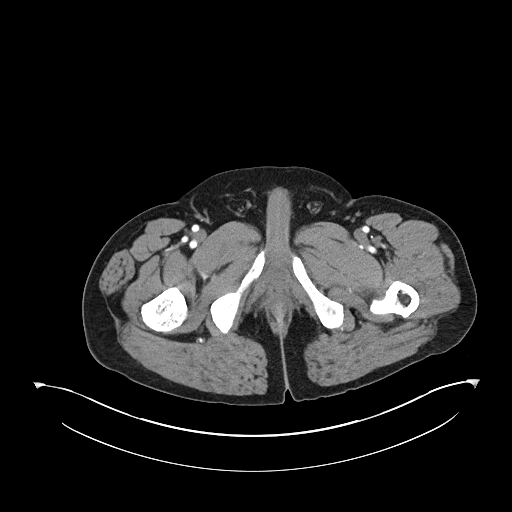
[im 38/114  soft-tissue]
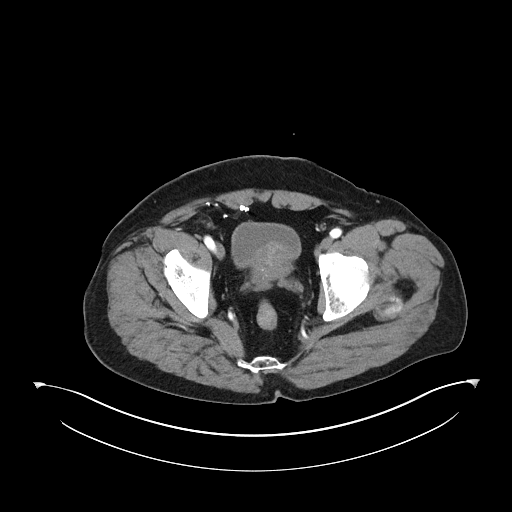
[im 46/114  soft-tissue]
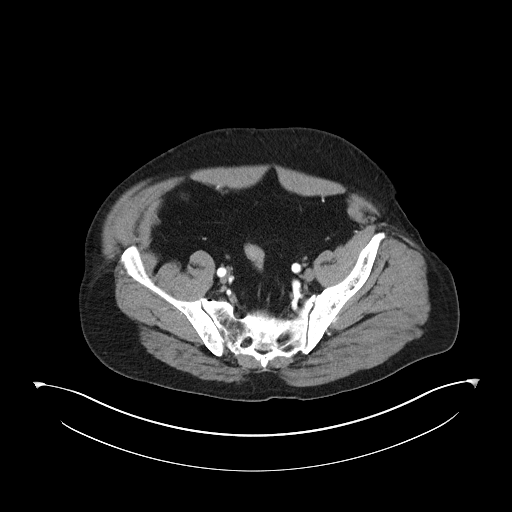
[im 53/114  soft-tissue]
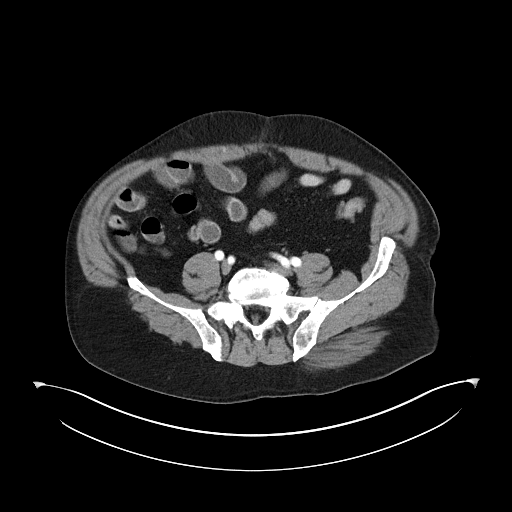
[im 61/114  soft-tissue]
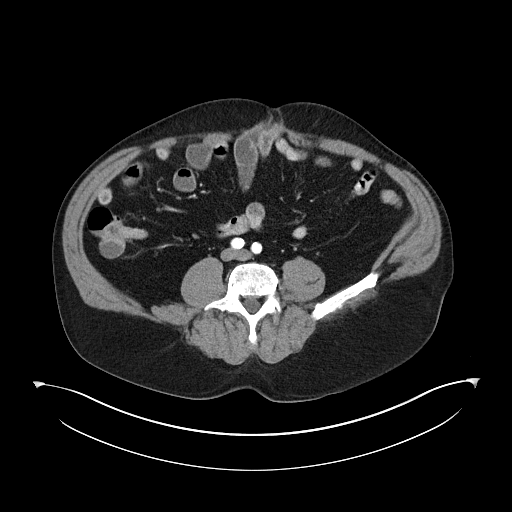
[im 68/114  soft-tissue]
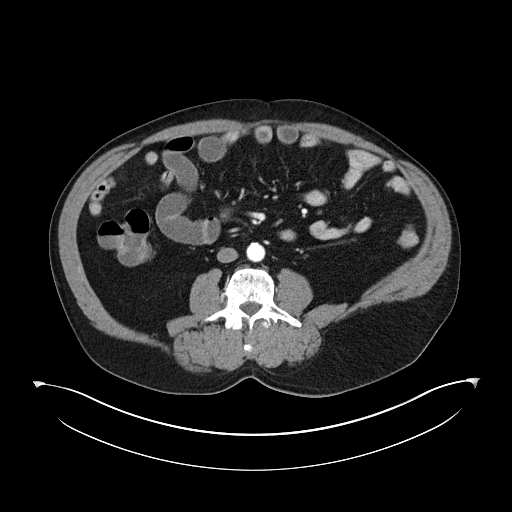
[im 76/114  soft-tissue]
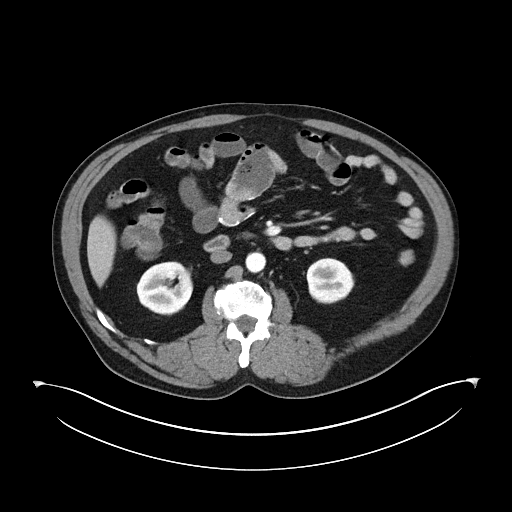
[im 76/114  bone]
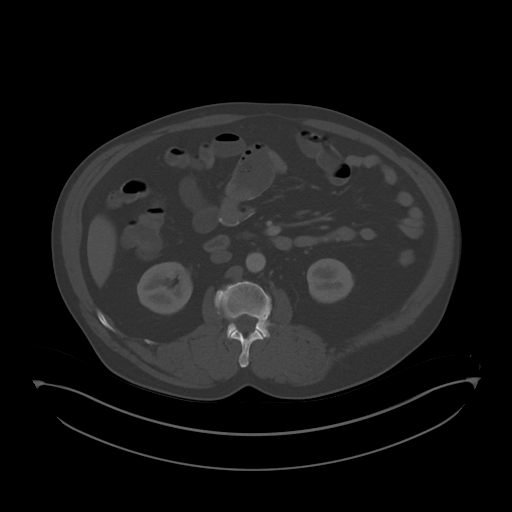
[im 91/114  soft-tissue]
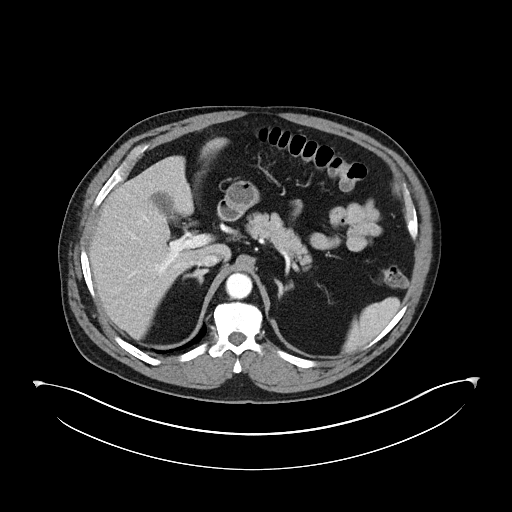
[im 98/114  soft-tissue]
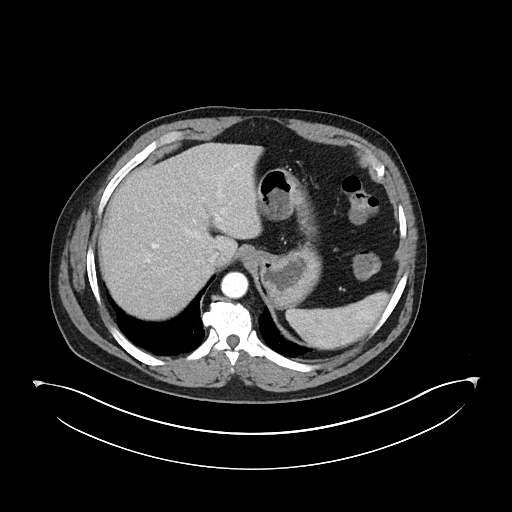
[im 106/114  soft-tissue]
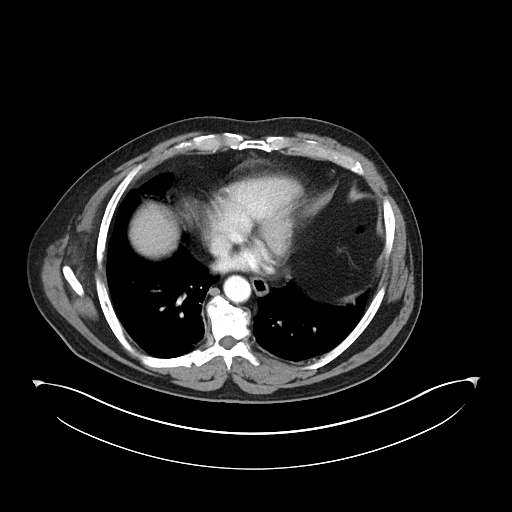

[Series 5: abdomen 3.0 mpr cor · coronal · 0.82mm/px · 3 of 102 slices shown]
[im 34/102  soft-tissue]
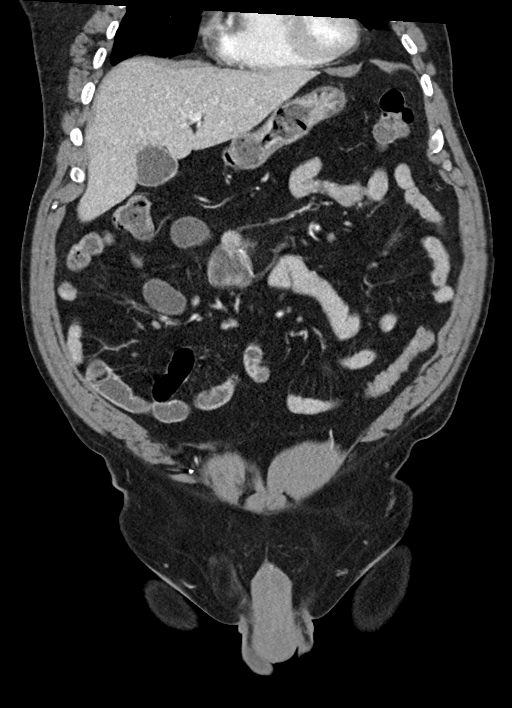
[im 45/102  soft-tissue]
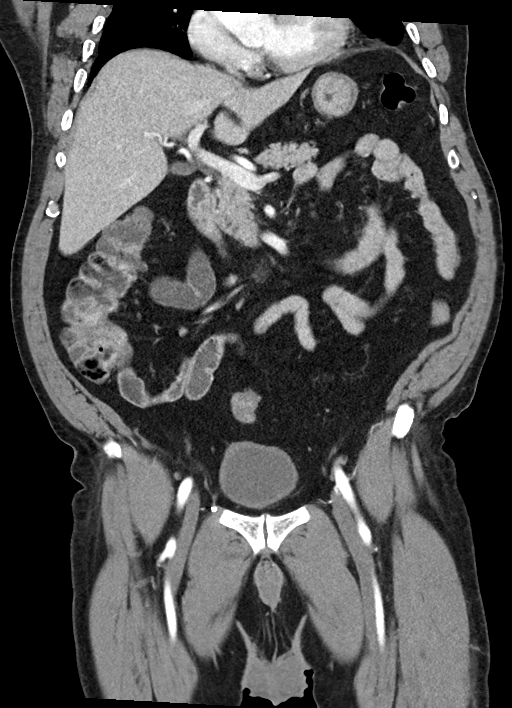
[im 57/102  soft-tissue]
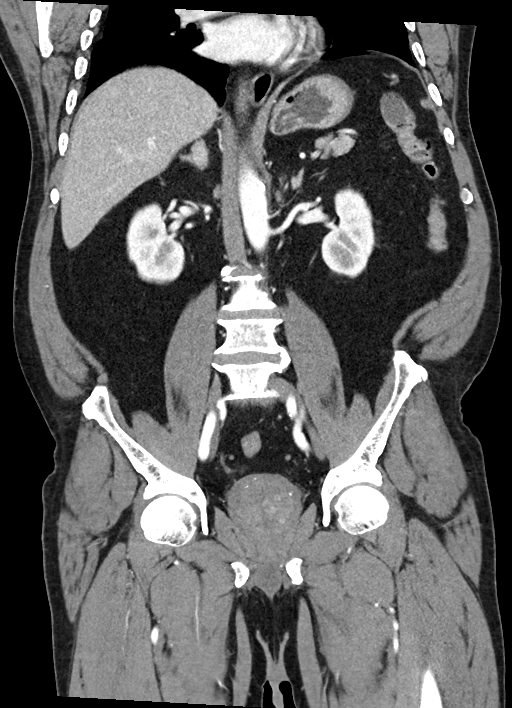

[15 of 46 positions shown; findings below may reference images not displayed]

RADIATION DOSE REDUCTION: This exam was performed according to the
departmental dose-optimization program which includes automated
exposure control, adjustment of the mA and/or kV according to
patient size and/or use of iterative reconstruction technique.

CONTRAST:  100mL OMNIPAQUE IOHEXOL 300 MG/ML  SOLN
FINDINGS: Lower chest: No acute abnormality.

Hepatobiliary: No focal liver abnormality is seen. No gallstones,
gallbladder wall thickening, or biliary dilatation.

Pancreas: Unremarkable. No pancreatic ductal dilatation or
surrounding inflammatory changes.

Spleen: Normal in size without focal abnormality.

Adrenals/Urinary Tract: Adrenal glands are unremarkable. Kidneys are
normal, without renal calculi, focal lesion, or hydronephrosis.
Bladder is unremarkable. Small hypodense cortical structures likely
renal cysts, too small to characterize.

Stomach/Bowel: Stomach is within normal limits. Appendix appears
normal. Surgical sutures in the mid abdomen representing prior
anastomosis. Liquid stool throughout the bowel loops. Mild mucosal
enhancement of the bowel wall.

Vascular/Lymphatic: Aortic atherosclerosis. No enlarged abdominal or
pelvic lymph nodes.

Reproductive: Prostate is enlarged with coarse calcifications
measuring approximately 5.6 cm in transverse dimension.

Other: No abdominal wall hernia or abnormality. No abdominopelvic
ascites.

Musculoskeletal: No acute or significant osseous findings.
IMPRESSION: 1. Liquid stool throughout bowel loops with mild mucosal
enhancement, which may represent enterocolitis.

2.  No bowel wall thickening or extraluminal free air.

3.  Enlarged prostate.

4.  Degenerate disc disease of the lumbar spine.
